# Patient Record
Sex: Female | Born: 1990
Health system: Southern US, Community
[De-identification: ages and names within clinical notes are randomized; demographics above are authoritative.]

## PROBLEM LIST (undated history)

## (undated) ENCOUNTER — Inpatient Hospital Stay (HOSPITAL_COMMUNITY): Payer: Self-pay

## (undated) DIAGNOSIS — J302 Other seasonal allergic rhinitis: Secondary | ICD-10-CM

## (undated) DIAGNOSIS — O139 Gestational [pregnancy-induced] hypertension without significant proteinuria, unspecified trimester: Secondary | ICD-10-CM

## (undated) DIAGNOSIS — R519 Headache, unspecified: Secondary | ICD-10-CM

## (undated) DIAGNOSIS — E079 Disorder of thyroid, unspecified: Secondary | ICD-10-CM

## (undated) DIAGNOSIS — E039 Hypothyroidism, unspecified: Secondary | ICD-10-CM

## (undated) DIAGNOSIS — R51 Headache: Secondary | ICD-10-CM

## (undated) HISTORY — DX: Disorder of thyroid, unspecified: E07.9

## (undated) HISTORY — PX: WISDOM TOOTH EXTRACTION: SHX21

## (undated) HISTORY — PX: OTHER SURGICAL HISTORY: SHX169

## (undated) HISTORY — DX: Gestational (pregnancy-induced) hypertension without significant proteinuria, unspecified trimester: O13.9

---

## 2011-05-15 ENCOUNTER — Encounter: Payer: Self-pay | Admitting: Internal Medicine

## 2011-05-15 ENCOUNTER — Ambulatory Visit (INDEPENDENT_AMBULATORY_CARE_PROVIDER_SITE_OTHER): Payer: BC Managed Care – PPO | Admitting: Internal Medicine

## 2011-05-15 VITALS — BP 114/72 | HR 80 | Temp 98.2°F | Resp 16 | Ht 66.0 in | Wt 201.0 lb

## 2011-05-15 DIAGNOSIS — E010 Iodine-deficiency related diffuse (endemic) goiter: Secondary | ICD-10-CM | POA: Insufficient documentation

## 2011-05-15 DIAGNOSIS — J310 Chronic rhinitis: Secondary | ICD-10-CM

## 2011-05-15 DIAGNOSIS — E669 Obesity, unspecified: Secondary | ICD-10-CM | POA: Insufficient documentation

## 2011-05-15 DIAGNOSIS — E079 Disorder of thyroid, unspecified: Secondary | ICD-10-CM

## 2011-05-15 DIAGNOSIS — E039 Hypothyroidism, unspecified: Secondary | ICD-10-CM

## 2011-05-15 LAB — COMPREHENSIVE METABOLIC PANEL
ALT: 17 U/L (ref 0–35)
AST: 17 U/L (ref 0–37)
Albumin: 4.3 g/dL (ref 3.5–5.2)
Alkaline Phosphatase: 50 U/L (ref 39–117)
BUN: 11 mg/dL (ref 6–23)
CO2: 27 mEq/L (ref 19–32)
Calcium: 9.2 mg/dL (ref 8.4–10.5)
Chloride: 104 mEq/L (ref 96–112)
Creatinine, Ser: 0.7 mg/dL (ref 0.4–1.2)
GFR: 121.29 mL/min (ref >60.00)
Glucose, Bld: 86 mg/dL (ref 70–99)
Potassium: 4 mEq/L (ref 3.5–5.1)
Sodium: 138 mEq/L (ref 135–145)
Total Bilirubin: 0.3 mg/dL (ref 0.3–1.2)
Total Protein: 7.4 g/dL (ref 6.0–8.3)

## 2011-05-15 LAB — LIPID PANEL
Cholesterol: 175 mg/dL (ref 0–200)
HDL: 49.5 mg/dL (ref >39.00)
LDL Cholesterol: 117 mg/dL — ABNORMAL HIGH (ref 0–99)
Total CHOL/HDL Ratio: 4
Triglycerides: 43 mg/dL (ref 0.0–149.0)
VLDL: 8.6 mg/dL (ref 0.0–40.0)

## 2011-05-15 LAB — TSH: TSH: 0.72 u[IU]/mL (ref 0.35–5.50)

## 2011-05-15 NOTE — Patient Instructions (Signed)
We will send you results of labs by phone or My Chart

## 2011-05-15 NOTE — Progress Notes (Signed)
Patient ID: Christina Costa, female   DOB: 1990-09-19, 21 y.o.   MRN: [id number] Patient Active Problem List  Diagnoses  . hypothyroidisn  . Obesity (BMI 30-39.9)    Subjective:  CC:   Chief Complaint  Patient presents with  . New Patient    HPI:   Christina Murdockis a 21 y.o. female who presents to establish care.  She is a healthy 21 yr old female G0P0 who has had treated hypothyroidism since age 89.  Menarche at age 84. Regular menstrual periods. Occasional episodes of allergic rhinitis, seasonal, for the last two years ,  managed with zyrtec.    Works full time at  [employer] therapist .  Christina Costa to relax at home on her days off. No regular exercise program,  No recent changes in weight.  She has plans to start exercising because she does want to lose weight.     Past Medical History  Diagnosis Date  . hypothyroidisn     since age 5    History reviewed. No pertinent past surgical history.       The following portions of the patient's history were reviewed and updated as appropriate: Allergies, current medications, and problem list.    Review of Systems:   12 Pt  review of systems was negative except those addressed in the HPI,     History   Social History  . Marital Status: Single    Spouse Name: N/A    Number of Children: N/A  . Years of Education: N/A   Occupational History  . Not on file.   Social History Main Topics  . Smoking status: Never Smoker   . Smokeless tobacco: Never Used  . Alcohol Use: No  . Drug Use: No  . Sexually Active: No   Other Topics Concern  . Not on file   Social History Narrative  . No narrative on file    Objective:  BP 114/72  Pulse 80  Temp(Src) 98.2 F (36.8 C) (Oral)  Resp 16  Ht 5\' 6"  (1.676 m)  Wt 201 lb (91.173 kg)  BMI 32.44 kg/m2  SpO2 100%  LMP 05/02/2011  General appearance: alert, cooperative and appears stated age Ears: normal TM's and external ear canals both ears Throat: lips, mucosa,  and tongue normal; teeth and gums normal Neck: no adenopathy, no carotid bruit, supple, symmetrical, trachea midline and thyroid not enlarged, symmetric, no tenderness/mass/nodules Back: symmetric, no curvature. ROM normal. No CVA tenderness. Lungs: clear to auscultation bilaterally Heart: regular rate and rhythm, S1, S2 normal, no murmur, click, rub or gallop Abdomen: soft, non-tender; bowel sounds normal; no masses,  no organomegaly Pulses: 2+ and symmetric Skin: Skin color, texture, turgor normal. No rashes or lesions Lymph nodes: Cervical, supraclavicular, and axillary nodes normal.  Assessment and Plan:  Obesity (BMI 30-39.9) I have addressed  BMI and recommended a low glycemic index diet utilizing smaller more frequent meals to increase metabolism.  I have also recommended that patient start exercising with a goal of 30 minutes of aerobic exercise a minimum of 5 days per week. Screening for lipid disorders, thyroid and diabetes to be done today.   hypothyroidisn Treated since age 71.  TSH due. Discussed goal to keep TSH around 1.     Updated Medication List Outpatient Encounter Prescriptions as of 05/15/2011  Medication Sig Dispense Refill  . cetirizine (ZYRTEC) 10 MG tablet Take 10 mg by mouth daily.      . Cholecalciferol (VITAMIN D3) 2000 UNITS TABS  Take by mouth.      . fish oil-omega-3 fatty acids 1000 MG capsule Take 2 g by mouth daily.      . Iron-Folic Acid-Vit B12 (IRON FORMULA PO) Take by mouth.      . levothyroxine (SYNTHROID, LEVOTHROID) 100 MCG tablet Take 100 mcg by mouth daily.      . Multiple Vitamin (MULTIVITAMIN) tablet Take 1 tablet by mouth daily.         Orders Placed This Encounter  Procedures  . TSH  . COMPLETE METABOLIC PANEL WITH GFR  . Lipid panel  . Comprehensive metabolic panel    No Follow-up on file.

## 2011-05-18 ENCOUNTER — Encounter: Payer: Self-pay | Admitting: Internal Medicine

## 2011-05-18 NOTE — Assessment & Plan Note (Signed)
Treated since age 21.  TSH due. Discussed goal to keep TSH around 1.

## 2011-05-18 NOTE — Assessment & Plan Note (Signed)
I have addressed  BMI and recommended a low glycemic index diet utilizing smaller more frequent meals to increase metabolism.  I have also recommended that patient start exercising with a goal of 30 minutes of aerobic exercise a minimum of 5 days per week. Screening for lipid disorders, thyroid and diabetes to be done today.   

## 2011-05-21 ENCOUNTER — Encounter: Payer: Self-pay | Admitting: Internal Medicine

## 2011-05-26 ENCOUNTER — Encounter: Payer: Self-pay | Admitting: Internal Medicine

## 2011-05-27 ENCOUNTER — Other Ambulatory Visit: Payer: Self-pay | Admitting: *Deleted

## 2011-05-27 ENCOUNTER — Telehealth: Payer: Self-pay | Admitting: Internal Medicine

## 2011-05-27 DIAGNOSIS — E039 Hypothyroidism, unspecified: Secondary | ICD-10-CM

## 2011-05-27 MED ORDER — LEVOTHYROXINE SODIUM 100 MCG PO TABS: 100.0000 ug | ORAL_TABLET | Freq: Every day | ORAL | Status: DC

## 2011-05-27 NOTE — Telephone Encounter (Signed)
[facility] called and wanted to let you know they got a refill that Savahanna D sent in for patient for Levoxyl.  [facility] stated Levoxyl is on back order so the pharmacist said levothyroxine is a close medication.  They have substituted this medication for patient.

## 2011-06-02 ENCOUNTER — Other Ambulatory Visit: Payer: Self-pay | Admitting: Internal Medicine

## 2011-06-02 DIAGNOSIS — E039 Hypothyroidism, unspecified: Secondary | ICD-10-CM

## 2011-06-02 MED ORDER — LEVOTHYROXINE SODIUM 100 MCG PO TABS: 100.0000 ug | ORAL_TABLET | Freq: Every day | ORAL | Status: DC

## 2011-08-31 ENCOUNTER — Encounter: Payer: Self-pay | Admitting: Internal Medicine

## 2011-09-01 ENCOUNTER — Other Ambulatory Visit: Payer: Self-pay | Admitting: *Deleted

## 2011-09-01 DIAGNOSIS — E039 Hypothyroidism, unspecified: Secondary | ICD-10-CM

## 2011-09-01 MED ORDER — LEVOTHYROXINE SODIUM 100 MCG PO TABS: 100.0000 ug | ORAL_TABLET | Freq: Every day | ORAL | Status: DC

## 2012-03-05 ENCOUNTER — Encounter: Payer: Self-pay | Admitting: Adult Health

## 2012-03-05 ENCOUNTER — Ambulatory Visit (INDEPENDENT_AMBULATORY_CARE_PROVIDER_SITE_OTHER): Payer: BC Managed Care – PPO | Admitting: Adult Health

## 2012-03-05 ENCOUNTER — Ambulatory Visit (INDEPENDENT_AMBULATORY_CARE_PROVIDER_SITE_OTHER)
Admission: RE | Admit: 2012-03-05 | Discharge: 2012-03-05 | Disposition: A | Discharge status: Home / Self Care | Payer: BC Managed Care – PPO | Source: Ambulatory Visit | Attending: Adult Health | Admitting: Adult Health

## 2012-03-05 VITALS — BP 118/80 | HR 74 | Temp 98.6°F | Resp 14 | Ht 66.0 in | Wt 198.0 lb

## 2012-03-05 DIAGNOSIS — M25519 Pain in unspecified shoulder: Secondary | ICD-10-CM

## 2012-03-05 DIAGNOSIS — E039 Hypothyroidism, unspecified: Secondary | ICD-10-CM

## 2012-03-05 MED ORDER — CYCLOBENZAPRINE HCL 5 MG PO TABS: 5.0000 mg | ORAL_TABLET | Freq: Three times a day (TID) | ORAL | Status: DC | PRN

## 2012-03-05 MED ORDER — LEVOTHYROXINE SODIUM 100 MCG PO TABS: 100.0000 ug | ORAL_TABLET | Freq: Every day | ORAL | Status: DC

## 2012-03-05 NOTE — Progress Notes (Signed)
  Subjective:    Patient ID: Christina Costa, female    DOB: Feb 22, 1990, 22 y.o.   MRN: [id number]  HPI  Patient is a pleasant 22 y/o female who presents to clinic with c/o right shoulder pain that occasionally radiates to her neck. 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 She reports that she helped stack some wood for a relative on Monday evening. On Tuesday morning she woke up with the right shoulder pain. She did not think much about it and went to work. She notice the pain began to intensify especially while using her right elbow to apply pressure during her massage sessions. She has applied ice and taken ibuprofen since Wednesday when she had a day off at work. She describes the pain as dull with occasional sharp pain. Denies temperature alteration, color changes, numbness or loss of sensation in her right extremity.  Current Outpatient Prescriptions on File Prior to Visit  Medication Sig Dispense Refill  . levothyroxine (SYNTHROID, LEVOTHROID) 100 MCG tablet Take 1 tablet (100 mcg total) by mouth daily.  90 tablet  3  . cetirizine (ZYRTEC) 10 MG tablet Take 10 mg by mouth daily.      . Cholecalciferol (VITAMIN D3) 2000 UNITS TABS Take by mouth.      . fish oil-omega-3 fatty acids 1000 MG capsule Take 2 g by mouth daily.      . Iron-Folic Acid-Vit B12 (IRON FORMULA PO) Take by mouth.      . Multiple Vitamin (MULTIVITAMIN) tablet Take 1 tablet by mouth daily.         Review of Systems  Constitutional: Negative.   Respiratory: Negative.   Cardiovascular: Negative.   Musculoskeletal: Positive for joint swelling.       Pain in right shoulder area.  Skin: Negative for color change, pallor and rash.  Neurological: Negative for dizziness, weakness, light-headedness, numbness and headaches.  Psychiatric/Behavioral: Negative.     BP 118/80  Pulse 74  Temp 98.6 F (37 C) (Oral)  Resp 14  Ht 5\' 6"  (1.676 m)  Wt 198 lb (89.812 kg)  BMI 31.96 kg/m2  SpO2  100%  LMP 02/18/2012     Objective:   Physical Exam  Constitutional: She is oriented to person, place, and time. She appears well-developed and well-nourished. No distress.  Neck: Normal range of motion.  Cardiovascular: Normal rate, regular rhythm and intact distal pulses.   Pulmonary/Chest: Effort normal.  Musculoskeletal: She exhibits edema and tenderness.       Full range of motion right shoulder. Tenderness upon palpation of of trapezius muscle on the right. Tightness of trapezius noted. Palpation of shoulder joint without any abnormality noted. Comparison of bilateral shoulders shows slight swelling on the right superior aspect and significant tightness of muscles on the right.  Neurological: She is alert and oriented to person, place, and time. Coordination normal.  Skin: Skin is warm and dry. No rash noted. No erythema.  Psychiatric: She has a normal mood and affect. Her behavior is normal. Judgment and thought content normal.       Assessment & Plan:

## 2012-03-05 NOTE — Assessment & Plan Note (Addendum)
Right shoulder. Suspect this is muscular in nature given physical exam. Tight Trapezius muscle with slight inflammation of area. Will check routine shoulder xray to r/o any bony abnormality of the area. Start high dose NSAIDs 3 times daily for 3-4 days. Flexeril for muscle relaxation. Apply ice alternating with heat 3-4 times daily. Rest and immobilize joint with sling. Do ROM exercises as tolerated 3-4 times daily. Do not sleep on the right shoulder. Call if symptoms do not improve within 4-5 days. If no resolution will need referral to Ortho.

## 2012-03-05 NOTE — Patient Instructions (Addendum)
  Please go to our [facility] office for xray of your right shoulder.  Immobilize joint for the next 4-5 days with a sling. Gently move joint 3-4 times a day without over-stressing joint then put back in sling.  I have sent a prescription for Flexeril (muscle relaxer) to your pharmacy. You can take this 3 times a day. This medication can make you drowsy.  Take ibuprofen 600 mg - 800 mg, 3 times a day for the next 4 days.  Apply ice alternating with heat 3-4 times a day for 20 minutes at a time.  Please call if your symptoms are not improved within 4-5 days. Send me a MyChart message regarding how you are doing on Tuesday morning.

## 2012-03-08 ENCOUNTER — Other Ambulatory Visit: Payer: Self-pay | Admitting: Adult Health

## 2012-03-08 ENCOUNTER — Encounter: Payer: Self-pay | Admitting: Adult Health

## 2012-03-08 DIAGNOSIS — M25519 Pain in unspecified shoulder: Secondary | ICD-10-CM

## 2012-03-27 ENCOUNTER — Other Ambulatory Visit: Payer: Self-pay

## 2012-12-16 ENCOUNTER — Other Ambulatory Visit: Payer: Self-pay

## 2014-01-29 ENCOUNTER — Emergency Department (HOSPITAL_COMMUNITY)
Admission: EM | Admit: 2014-01-29 | Discharge: 2014-01-29 | Disposition: A | Discharge status: Home / Self Care | Payer: BC Managed Care – PPO | Attending: Emergency Medicine | Admitting: Emergency Medicine

## 2014-01-29 ENCOUNTER — Emergency Department (HOSPITAL_COMMUNITY): Payer: BC Managed Care – PPO

## 2014-01-29 DIAGNOSIS — Z3202 Encounter for pregnancy test, result negative: Secondary | ICD-10-CM | POA: Diagnosis not present

## 2014-01-29 DIAGNOSIS — E039 Hypothyroidism, unspecified: Secondary | ICD-10-CM | POA: Insufficient documentation

## 2014-01-29 DIAGNOSIS — N83209 Unspecified ovarian cyst, unspecified side: Secondary | ICD-10-CM

## 2014-01-29 DIAGNOSIS — N8320 Unspecified ovarian cysts: Secondary | ICD-10-CM | POA: Insufficient documentation

## 2014-01-29 DIAGNOSIS — R112 Nausea with vomiting, unspecified: Secondary | ICD-10-CM | POA: Diagnosis present

## 2014-01-29 DIAGNOSIS — Z79899 Other long term (current) drug therapy: Secondary | ICD-10-CM | POA: Diagnosis not present

## 2014-01-29 DIAGNOSIS — R1010 Upper abdominal pain, unspecified: Secondary | ICD-10-CM

## 2014-01-29 LAB — CBC WITH DIFFERENTIAL/PLATELET
Basophils Absolute: 0 10*3/uL (ref 0.0–0.1)
Basophils Relative: 0 % (ref 0–1)
Eosinophils Absolute: 0 10*3/uL (ref 0.0–0.7)
Eosinophils Relative: 0 % (ref 0–5)
HCT: 40.9 % (ref 36.0–46.0)
Hemoglobin: 13.8 g/dL (ref 12.0–15.0)
Lymphocytes Relative: 2 % — ABNORMAL LOW (ref 12–46)
Lymphs Abs: 0.3 10*3/uL — ABNORMAL LOW (ref 0.7–4.0)
MCH: 27.4 pg (ref 26.0–34.0)
MCHC: 33.7 g/dL (ref 30.0–36.0)
MCV: 81.3 fL (ref 78.0–100.0)
Monocytes Absolute: 0.5 10*3/uL (ref 0.1–1.0)
Monocytes Relative: 4 % (ref 3–12)
Neutro Abs: 12.6 10*3/uL — ABNORMAL HIGH (ref 1.7–7.7)
Neutrophils Relative %: 94 % — ABNORMAL HIGH (ref 43–77)
Platelets: 203 10*3/uL (ref 150–400)
RBC: 5.03 MIL/uL (ref 3.87–5.11)
RDW: 14.1 % (ref 11.5–15.5)
WBC: 13.5 10*3/uL — ABNORMAL HIGH (ref 4.0–10.5)

## 2014-01-29 LAB — POC URINE PREG, ED: Preg Test, Ur: NEGATIVE

## 2014-01-29 LAB — URINALYSIS, ROUTINE W REFLEX MICROSCOPIC
Bilirubin Urine: NEGATIVE
Glucose, UA: NEGATIVE mg/dL
Hgb urine dipstick: NEGATIVE
Ketones, ur: NEGATIVE mg/dL
Leukocytes, UA: NEGATIVE
Nitrite: NEGATIVE
Protein, ur: NEGATIVE mg/dL
Specific Gravity, Urine: 1.025 (ref 1.005–1.030)
Urobilinogen, UA: 0.2 mg/dL (ref 0.0–1.0)
pH: 7 (ref 5.0–8.0)

## 2014-01-29 LAB — COMPREHENSIVE METABOLIC PANEL
ALT: 16 U/L (ref 0–35)
AST: 17 U/L (ref 0–37)
Albumin: 4.5 g/dL (ref 3.5–5.2)
Alkaline Phosphatase: 61 U/L (ref 39–117)
Anion gap: 14 (ref 5–15)
BUN: 17 mg/dL (ref 6–23)
CO2: 23 mEq/L (ref 19–32)
Calcium: 9.2 mg/dL (ref 8.4–10.5)
Chloride: 103 mEq/L (ref 96–112)
Creatinine, Ser: 0.69 mg/dL (ref 0.50–1.10)
GFR calc Af Amer: >90 mL/min (ref >90)
GFR calc non Af Amer: >90 mL/min (ref >90)
Glucose, Bld: 104 mg/dL — ABNORMAL HIGH (ref 70–99)
Potassium: 4.5 mEq/L (ref 3.7–5.3)
Sodium: 140 mEq/L (ref 137–147)
Total Bilirubin: 0.4 mg/dL (ref 0.3–1.2)
Total Protein: 7.8 g/dL (ref 6.0–8.3)

## 2014-01-29 LAB — LIPASE, BLOOD: Lipase: 36 U/L (ref 11–59)

## 2014-01-29 MED ORDER — METOCLOPRAMIDE HCL 5 MG/ML IJ SOLN
10.0000 mg | Freq: Once | INTRAMUSCULAR | Status: AC
  Administered 2014-01-29: 10 mg via INTRAVENOUS
  Filled 2014-01-29: qty 2

## 2014-01-29 MED ORDER — PROMETHAZINE HCL 25 MG PO TABS: 25.0000 mg | ORAL_TABLET | Freq: Four times a day (QID) | ORAL | Status: DC | PRN

## 2014-01-29 MED ORDER — SODIUM CHLORIDE 0.9 % IV BOLUS (SEPSIS)
1000.0000 mL | Freq: Once | INTRAVENOUS | Status: AC
  Administered 2014-01-29: 1000 mL via INTRAVENOUS

## 2014-01-29 MED ORDER — GI COCKTAIL ~~LOC~~
30.0000 mL | Freq: Once | ORAL | Status: AC
  Administered 2014-01-29: 30 mL via ORAL
  Filled 2014-01-29: qty 30

## 2014-01-29 MED ORDER — TRAMADOL HCL 50 MG PO TABS: 50.0000 mg | ORAL_TABLET | Freq: Four times a day (QID) | ORAL | Status: DC | PRN

## 2014-01-29 MED ORDER — MORPHINE SULFATE 4 MG/ML IJ SOLN
4.0000 mg | Freq: Once | INTRAMUSCULAR | Status: AC
  Administered 2014-01-29: 4 mg via INTRAVENOUS
  Filled 2014-01-29: qty 1

## 2014-01-29 MED ORDER — IOHEXOL 300 MG/ML  SOLN
100.0000 mL | Freq: Once | INTRAMUSCULAR | Status: AC | PRN
  Administered 2014-01-29: 100 mL via INTRAVENOUS

## 2014-01-29 MED ORDER — ONDANSETRON 4 MG PO TBDP
8.0000 mg | ORAL_TABLET | Freq: Once | ORAL | Status: AC
  Administered 2014-01-29: 8 mg via ORAL
  Filled 2014-01-29: qty 2

## 2014-01-29 NOTE — ED Notes (Signed)
Pt reports having mid upper abd pain and n/v since this am. Denies diarrhea.

## 2014-01-29 NOTE — ED Notes (Signed)
Pt in stable condition and verbalizes understanding of d/c instructions. Pt wishes to ambulate from ED and denies use of a wheelchair.

## 2014-01-29 NOTE — ED Provider Notes (Signed)
CSN: [id number]     Arrival date & time 01/29/14  1210 History   First MD Initiated Contact with Patient 01/29/14 1245     Chief Complaint  Patient presents with  . Emesis  . Abdominal Pain     (Consider location/radiation/quality/duration/timing/severity/associated sxs/prior Treatment) HPI Pt is a 23yo female with hx of hypothyroidism presenting to ED with c/o sudden onset of upper abdominal pain associated with nausea and vomiting that started this this morning waking pt from her sleep.  Pt reports having over 10 episodes of vomiting PTA. Reports scant red blood in emesis at this time stating "I don't have anything else left in my stomach."  Abdominal pain is cramping and sharp "like someone is twisting my insides."  8/10 at worst, radiates to her mid-back.  Denies hx of similar symptoms.  Reports eating dinner last night, same food as her mother, felt fine.  Mother still feels fine.  No known sick contacts. Denies fever, chills, or diarrhea. Denies urinary or vaginal symptoms.  Denies chest pain or SOB.  Denies hx of abdominal surgeries.  Pt reports being glucose intolerant but denies any new foods. No other significant PMH. LMP: 12/30/13.  Past Medical History  Diagnosis Date  . hypothyroidisn     since age 23   No past surgical history on file. Family History  Problem Relation Age of Onset  . Allergy (severe) Mother   . Hypothyroidism Mother   . Arthritis Maternal Grandmother   . Heart disease Maternal Grandfather    History  Substance Use Topics  . Smoking status: Never Smoker   . Smokeless tobacco: Never Used  . Alcohol Use: No   OB History    No data available     Review of Systems  Constitutional: Negative for fever, chills and diaphoresis.  Respiratory: Negative for cough and shortness of breath.   Gastrointestinal: Positive for nausea, vomiting and abdominal pain ( epigastric, LUQ). Negative for diarrhea.  Genitourinary: Negative for dysuria, urgency, frequency,  flank pain, decreased urine volume, vaginal bleeding, vaginal discharge and pelvic pain.  All other systems reviewed and are negative.     Allergies  Gluten meal  Home Medications   Prior to Admission medications   Medication Sig Start Date End Date Taking? Authorizing Provider  IODINE, KELP, PO Take 1 tablet by mouth daily.   Yes Historical Provider, MD  levothyroxine (SYNTHROID, LEVOTHROID) 100 MCG tablet Take 1 tablet (100 mcg total) by mouth daily. 03/05/12  Yes Raquel Conni ElliotM Rey, NP  LEVOXYL 50 MCG tablet Take 25 mcg by mouth daily. 01/03/14  Yes Historical Provider, MD  cyclobenzaprine (FLEXERIL) 5 MG tablet Take 1 tablet (5 mg total) by mouth 3 (three) times daily as needed for muscle spasms. 03/05/12   Raquel Conni ElliotM Rey, NP  promethazine (PHENERGAN) 25 MG tablet Take 1 tablet (25 mg total) by mouth every 6 (six) hours as needed for nausea or vomiting. 01/29/14   Junius FinnerErin O'Malley, PA-C  traMADol (ULTRAM) 50 MG tablet Take 1 tablet (50 mg total) by mouth every 6 (six) hours as needed. 01/29/14   Junius FinnerErin O'Malley, PA-C   BP 128/97 mmHg  Pulse 102  Temp(Src) 97.7 F (36.5 C) (Oral)  Resp 16  Ht 5\' 6"  (1.676 m)  Wt 214 lb 2 oz (97.126 kg)  BMI 34.58 kg/m2  SpO2 100%  LMP 01/01/2014 Physical Exam  Constitutional: She appears well-developed and well-nourished. She appears distressed.  Pt sitting in exam bed with knees bent up to her chest.  Appears mildly uncomfortable.  HENT:  Head: Normocephalic and atraumatic.  Eyes: Conjunctivae are normal. No scleral icterus.  Neck: Normal range of motion.  Cardiovascular: Normal rate, regular rhythm and normal heart sounds.   Pulmonary/Chest: Effort normal and breath sounds normal. No respiratory distress. She has no wheezes. She has no rales. She exhibits no tenderness.  Abdominal: Soft. Bowel sounds are normal. She exhibits no distension and no mass. There is tenderness. There is no rebound and no guarding.  Soft, non-distended. Normal bowel sounds.  Tenderness to epigastrium and LUQ w/o rebound, guarding, or mass. No CVAT  Musculoskeletal: Normal range of motion.  Neurological: She is alert.  Skin: Skin is warm and dry. She is not diaphoretic.  Nursing note and vitals reviewed.   ED Course  Procedures (including critical care time) Labs Review Labs Reviewed  CBC WITH DIFFERENTIAL - Abnormal; Notable for the following:    WBC 13.5 (*)    Neutrophils Relative % 94 (*)    Neutro Abs 12.6 (*)    Lymphocytes Relative 2 (*)    Lymphs Abs 0.3 (*)    All other components within normal limits  COMPREHENSIVE METABOLIC PANEL - Abnormal; Notable for the following:    Glucose, Bld 104 (*)    All other components within normal limits  LIPASE, BLOOD  URINALYSIS, ROUTINE W REFLEX MICROSCOPIC  POC URINE PREG, ED    Imaging Review Ct Abdomen Pelvis W Contrast  01/29/2014   CLINICAL DATA:  Abdominal pain with emesis  EXAM: CT ABDOMEN AND PELVIS WITH CONTRAST  TECHNIQUE: Multidetector CT imaging of the abdomen and pelvis was performed using the standard protocol following bolus administration of intravenous contrast.  CONTRAST:  OMNIPAQUE IOHEXOL 300 MG/ML  SOLN  COMPARISON:  None.  FINDINGS: Lung bases are clear.  There is mild fatty infiltration in the liver. No focal liver lesions are identified. The gallbladder wall is not thickened. There is no biliary duct dilatation.  Spleen, pancreas, and adrenals appear normal.  Kidneys bilaterally show no mass or hydronephrosis. There is no renal or ureteral calculus on either side.  In the pelvis, the urinary bladder is midline with normal wall thickness. There is a small amount of free fluid in the cul-de-sac. The uterus has a septate appearance with two uterine horns more superiorly. There is no pelvic mass appreciable. Appendix region appears normal without inflammation.  There is a small ventral hernia containing only fat.  There is no bowel obstruction. No free air or portal venous air. There is  no appreciable adenopathy or abscess in the abdomen or pelvis. There is no demonstrable abdominal aortic aneurysm. There are no blastic or lytic bone lesions.  IMPRESSION: No mesenteric inflammation or abscess. No appendiceal region inflammation. No renal or ureteral calculus. No hydronephrosis.  Small amount of free fluid in cul-de-sac. Suspect recent ovarian cyst rupture.  Evidence of a septate uterus.  Small ventral hernia containing only fat.  Mild hepatic steatosis.   Electronically Signed   By: Bretta Bang M.D.   On: 01/29/2014 15:08     EKG Interpretation None      MDM   Final diagnoses:  Upper abdominal pain  Nausea and vomiting in adult  Ovarian cyst rupture    Pt is a 23yo female presenting to ED with c/o upper abdominal pain, nausea and vomiting, started this morning.  No significant PMH. On exam, pt tender in epigastrium and LUQ. No rebound or guarding. Pt is afebrile, mild tachycardia in triage, likely  due to pt being in pain as she denies CP or SOB, O2-100% on RA. Doubt PE.   Tx in ED: zofran in triage which helped pt's nausea, IV fluids and morphine.    Labs: mildly elevated WBC 13.5, labs otherwise unremarkable. Normal UA, Urine preg: negative.   Discussed pt with Dr. Radford PaxBeaton, due to pt having upper abdominal pain with the nausea and vomiting w/o diarrhea, and no known sick contacts or bad food, will get CT abdomen for further evaluation, concern for cholecystitis vs cholelithiasis vs SBO.    3:25 PM CT abdomen significant for suspected recent ovarian cyst rupture, otherwise unremarkable. No mesenteric inflammation or abscess. No appendiceal region inflammation. No renal or ureteral  calculus. No hydronephrosis.  No evidence of emergent process taking place. Discussed imaging with pt.  Pt is agreeable with plan to discharge home pending PO fluid challenge. Pt also requesting more pain medication stating pain is starting to return.    Pt able to keep down PO fluids. Will  discharge home to f/u with PCP in 2-3 days for recheck of symptoms if not improving. Rx: tramadol and phenergan. Home care instructions provided. Return precautions provided. Pt verbalized understanding and agreement with tx plan.         Junius Finnerrin O'Malley, PA-C 01/29/14 1604  Nelia Shiobert L Beaton, MD 01/29/14 [phone]

## 2014-12-01 ENCOUNTER — Encounter: Payer: Self-pay | Admitting: Nurse Practitioner

## 2014-12-01 ENCOUNTER — Ambulatory Visit (INDEPENDENT_AMBULATORY_CARE_PROVIDER_SITE_OTHER): Payer: BC Managed Care – PPO | Admitting: Nurse Practitioner

## 2014-12-01 VITALS — BP 130/98 | HR 84 | Temp 98.4°F | Resp 12 | Ht 66.0 in | Wt 228.2 lb

## 2014-12-01 DIAGNOSIS — R110 Nausea: Secondary | ICD-10-CM | POA: Diagnosis not present

## 2014-12-01 DIAGNOSIS — K920 Hematemesis: Secondary | ICD-10-CM | POA: Diagnosis not present

## 2014-12-01 LAB — COMPREHENSIVE METABOLIC PANEL
ALT: 20 U/L (ref 0–35)
AST: 18 U/L (ref 0–37)
Albumin: 4.6 g/dL (ref 3.5–5.2)
Alkaline Phosphatase: 53 U/L (ref 39–117)
BUN: 9 mg/dL (ref 6–23)
CO2: 29 mEq/L (ref 19–32)
Calcium: 9.7 mg/dL (ref 8.4–10.5)
Chloride: 103 mEq/L (ref 96–112)
Creatinine, Ser: 0.79 mg/dL (ref 0.40–1.20)
GFR: 95.35 mL/min (ref >60.00)
Glucose, Bld: 97 mg/dL (ref 70–99)
Potassium: 4.1 mEq/L (ref 3.5–5.1)
Sodium: 139 mEq/L (ref 135–145)
Total Bilirubin: 0.3 mg/dL (ref 0.2–1.2)
Total Protein: 7.5 g/dL (ref 6.0–8.3)

## 2014-12-01 LAB — CBC WITH DIFFERENTIAL/PLATELET
Basophils Absolute: 0 10*3/uL (ref 0.0–0.1)
Basophils Relative: 0.2 % (ref 0.0–3.0)
Eosinophils Absolute: 0 10*3/uL (ref 0.0–0.7)
Eosinophils Relative: 0.5 % (ref 0.0–5.0)
HCT: 40.1 % (ref 36.0–46.0)
Hemoglobin: 13.1 g/dL (ref 12.0–15.0)
Lymphocytes Relative: 23.1 % (ref 12.0–46.0)
Lymphs Abs: 2 10*3/uL (ref 0.7–4.0)
MCHC: 32.7 g/dL (ref 30.0–36.0)
MCV: 82.1 fl (ref 78.0–100.0)
Monocytes Absolute: 0.5 10*3/uL (ref 0.1–1.0)
Monocytes Relative: 6.1 % (ref 3.0–12.0)
Neutro Abs: 6 10*3/uL (ref 1.4–7.7)
Neutrophils Relative %: 70.1 % (ref 43.0–77.0)
Platelets: 252 10*3/uL (ref 150.0–400.0)
RBC: 4.89 Mil/uL (ref 3.87–5.11)
RDW: 14.3 % (ref 11.5–15.5)
WBC: 8.5 10*3/uL (ref 4.0–10.5)

## 2014-12-01 LAB — LIPASE: Lipase: 32 U/L (ref 11.0–59.0)

## 2014-12-01 MED ORDER — PROMETHAZINE HCL 25 MG PO TABS: 25.0000 mg | ORAL_TABLET | Freq: Four times a day (QID) | ORAL | Status: DC | PRN

## 2014-12-01 NOTE — Patient Instructions (Signed)
Please visit the lab before leaving today.   Lots of water and rest! If your urine turns dark despite drinking water or you are not urinating please seek emergency care.   Phenergan for nausea and small bland meals

## 2014-12-01 NOTE — Progress Notes (Signed)
Patient ID: Christina Costa, female    DOB: 1990-08-08  Age: 24 y.o. MRN: [id number]  CC: Nausea   HPI [name] presents for CC of nausea x 6 days approx.  1) Nausea- Daily then eases by the afternoon and back again in the evenings. Started late Sunday evening  Wednesday threw up some blood she reports, dry heaving with some fluid, bright red she reports and a streak, did not happen again.  Fatigue yesterday and this morning   Tylenol severe cold/flu Sun, Mon, Tue Essential oils for congestion- Doterra DigestZen oil   Drinking water, urine is pale she reports, BMs loose  Eating less, soup lunch and dinner   LMP- 11/12/14 through 11/15/14 heavier than usual   Not sexually active   History [name] has a past medical history of hypothyroidisn.   She has no past surgical history on file.   Her family history includes Allergy (severe) in her mother; Arthritis in her maternal grandmother; Heart disease in her maternal grandfather; Hypothyroidism in her mother.She reports that she has never smoked. She has never used smokeless tobacco. She reports that she does not drink alcohol or use illicit drugs.  Outpatient Prescriptions Prior to Visit  Medication Sig Dispense Refill  . cyclobenzaprine (FLEXERIL) 5 MG tablet Take 1 tablet (5 mg total) by mouth 3 (three) times daily as needed for muscle spasms. 20 tablet 0  . IODINE, KELP, PO Take 1 tablet by mouth daily.    Marland Kitchen levothyroxine (SYNTHROID, LEVOTHROID) 100 MCG tablet Take 1 tablet (100 mcg total) by mouth daily. 90 tablet 3  . LEVOXYL 50 MCG tablet Take 25 mcg by mouth daily.    . promethazine (PHENERGAN) 25 MG tablet Take 1 tablet (25 mg total) by mouth every 6 (six) hours as needed for nausea or vomiting. 10 tablet 0  . traMADol (ULTRAM) 50 MG tablet Take 1 tablet (50 mg total) by mouth every 6 (six) hours as needed. 15 tablet 0   No facility-administered medications prior to visit.    ROS Review of Systems  Constitutional: Positive  for appetite change and fatigue. Negative for fever, chills and diaphoresis.  HENT: Positive for congestion and postnasal drip. Negative for rhinorrhea, sinus pressure, sneezing and sore throat.   Eyes: Negative for visual disturbance.  Respiratory: Negative for cough, chest tightness and wheezing.   Cardiovascular: Negative for chest pain, palpitations and leg swelling.  Gastrointestinal: Positive for nausea, vomiting and diarrhea. Negative for abdominal pain, constipation, blood in stool, abdominal distention, anal bleeding and rectal pain.  Genitourinary: Negative for difficulty urinating.  Skin: Negative for rash.   Objective:  BP 130/98 mmHg  Pulse 84  Temp(Src) 98.4 F (36.9 C)  Resp 12  Ht '5\' 6"'  (1.676 m)  Wt 228 lb 3.2 oz (103.511 kg)  BMI 36.85 kg/m2  SpO2 98%  Physical Exam  Constitutional: She is oriented to person, place, and time. She appears well-developed and well-nourished. No distress.  HENT:  Head: Normocephalic and atraumatic.  Right Ear: External ear normal.  Left Ear: External ear normal.  Cardiovascular: Normal rate, regular rhythm, normal heart sounds and intact distal pulses.  Exam reveals no gallop and no friction rub.   No murmur heard. Pulmonary/Chest: Effort normal and breath sounds normal. No respiratory distress. She has no wheezes. She has no rales. She exhibits no tenderness.  Abdominal: Soft. Bowel sounds are normal. She exhibits no distension and no mass. There is no tenderness. There is no rebound and no guarding.  Neurological:  She is alert and oriented to person, place, and time. No cranial nerve deficit. She exhibits normal muscle tone. Coordination normal.  Skin: Skin is warm and dry. No rash noted. She is not diaphoretic.  Psychiatric: She has a normal mood and affect. Her behavior is normal. Judgment and thought content normal.   Assessment & Plan:   Sarrinah was seen today for nausea.  Diagnoses and all orders for this visit:  Hematemesis  with nausea -     CBC w/Diff -     Comp Met (CMET) -     Lipase  Other orders -     promethazine (PHENERGAN) 25 MG tablet; Take 1 tablet (25 mg total) by mouth every 6 (six) hours as needed for nausea or vomiting.   I have discontinued Ms. Murdock's traMADol. I am also having her maintain her cyclobenzaprine, levothyroxine, (IODINE, KELP, PO), LEVOXYL, and promethazine.  Meds ordered this encounter  Medications  . promethazine (PHENERGAN) 25 MG tablet    Sig: Take 1 tablet (25 mg total) by mouth every 6 (six) hours as needed for nausea or vomiting.    Dispense:  10 tablet    Refill:  0    Order Specific Question:  Supervising Provider    Answer:  Crecencio Mc [[id number]]     Follow-up: Return if symptoms worsen or fail to improve.

## 2014-12-01 NOTE — Progress Notes (Signed)
Pre visit review using our clinic review tool, if applicable. No additional management support is needed unless otherwise documented below in the visit note. 

## 2014-12-17 ENCOUNTER — Encounter: Payer: Self-pay | Admitting: Nurse Practitioner

## 2014-12-17 DIAGNOSIS — K920 Hematemesis: Secondary | ICD-10-CM | POA: Insufficient documentation

## 2014-12-17 NOTE — Assessment & Plan Note (Signed)
New onset. Small amount and 1 episode of hematemesis. Pt is well appearring today and with and unremarkable exam. Will give phenergan (sent to pharmacy), drink lots of non-caffeinated fluids, seek care if urine becomes dark or stops, rest and try bland foods. FU prn worsening/failure to improve.

## 2015-08-21 ENCOUNTER — Encounter: Payer: Self-pay | Admitting: Nurse Practitioner

## 2016-06-17 IMAGING — CT CT ABD-PELV W/ CM
2 of 4 series · 16 of 46 positions shown, 18 images · IV contrast (Omni 300)
Comparison: None.

CLINICAL DATA: Abdominal pain with emesis

EXAM:
CT ABDOMEN AND PELVIS WITH CONTRAST
TECHNIQUE: Multidetector CT imaging of the abdomen and pelvis was performed
using the standard protocol following bolus administration of
intravenous contrast.
CONTRAST:  100mL OMNIPAQUE IOHEXOL 300 MG/ML  SOLN

[Series 2: abd/ pelvis 5.0 i30f 1 · axial · 0.74mm/px · z∈[-325,+165]mm · 13 of 108 slices shown, 15 images]
[im 5/108  soft-tissue]
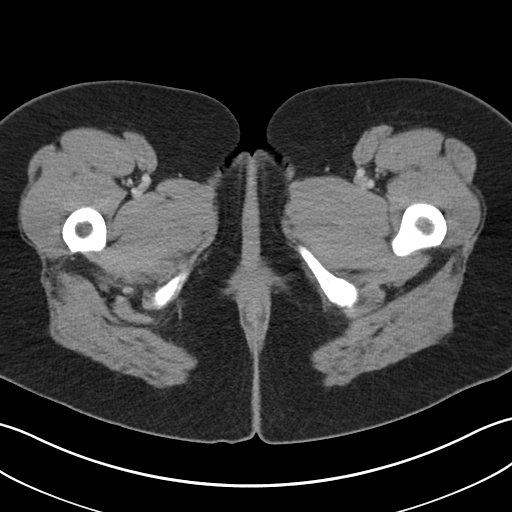
[im 5/108  bone]
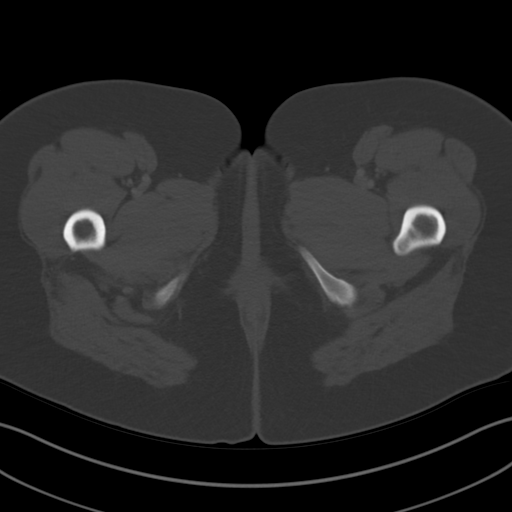
[im 13/108  soft-tissue]
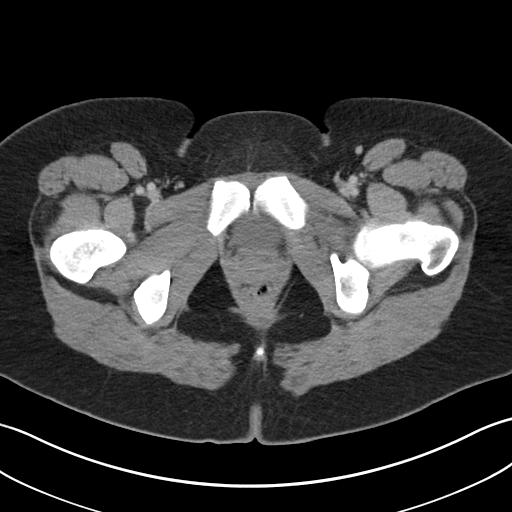
[im 22/108  soft-tissue]
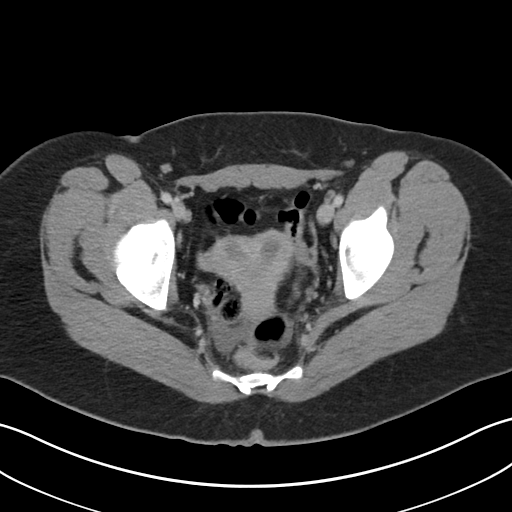
[im 30/108  soft-tissue]
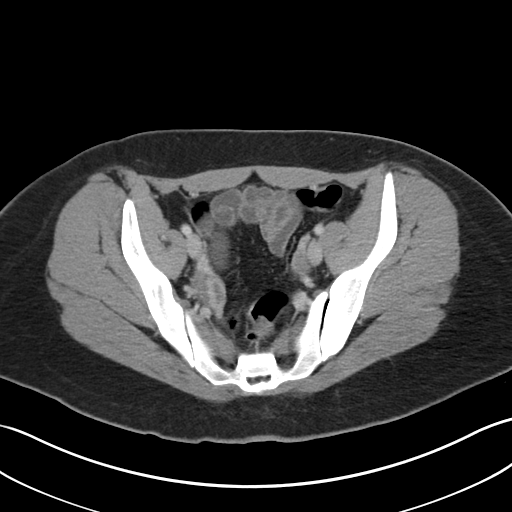
[im 39/108  soft-tissue]
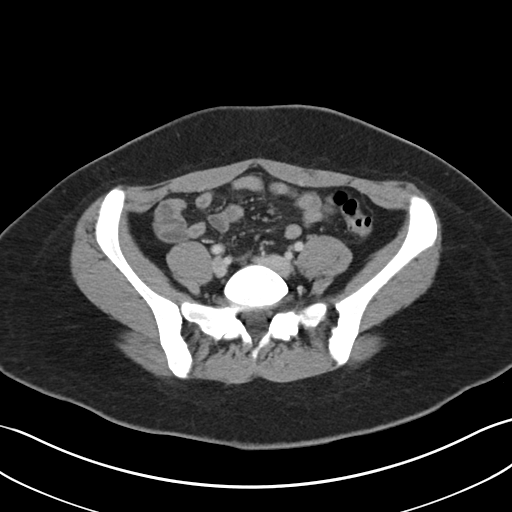
[im 48/108  soft-tissue]
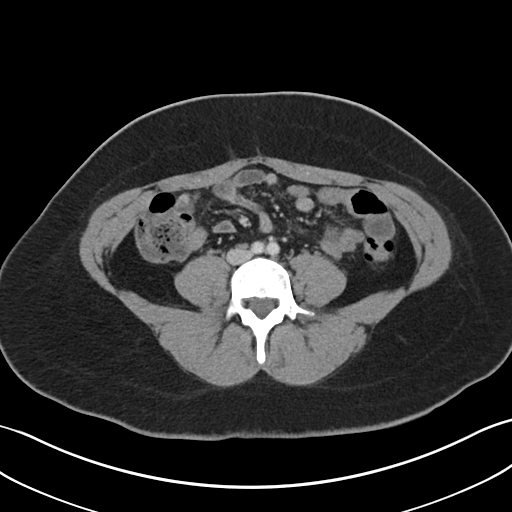
[im 56/108  soft-tissue]
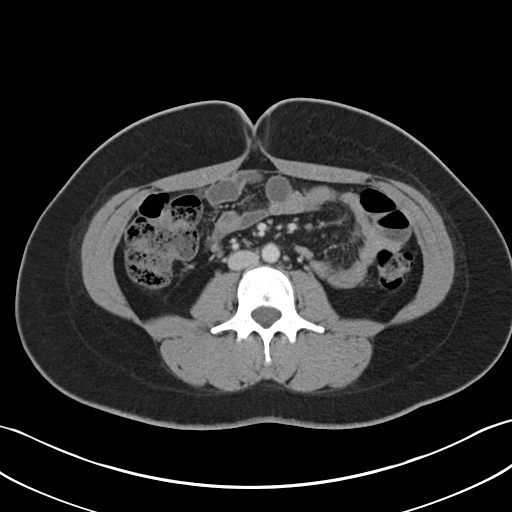
[im 60/108  soft-tissue]
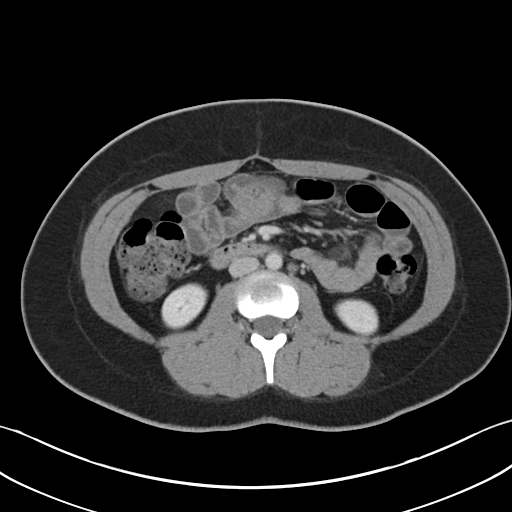
[im 69/108  soft-tissue]
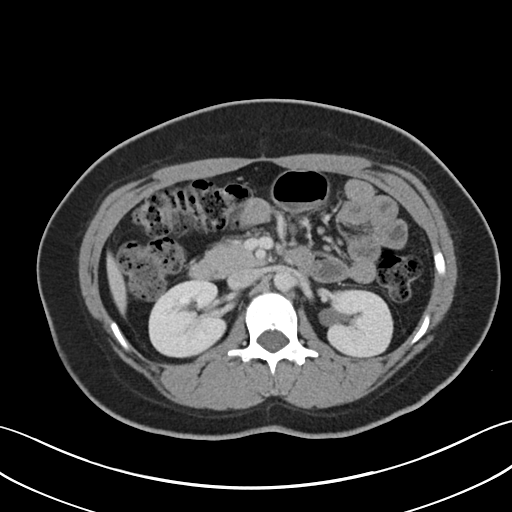
[im 69/108  bone]
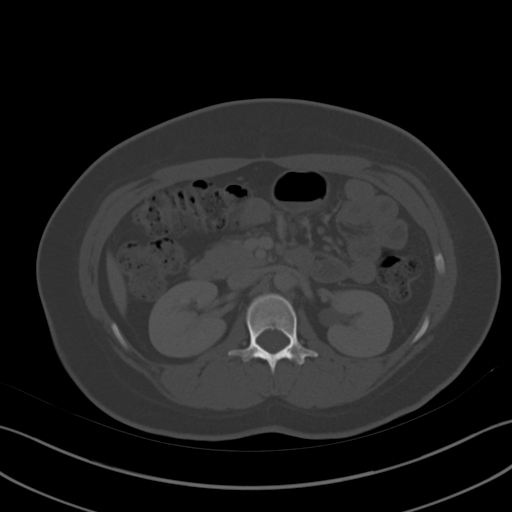
[im 78/108  soft-tissue]
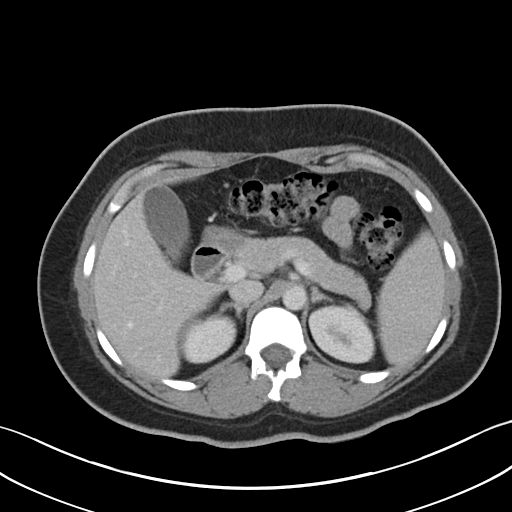
[im 86/108  soft-tissue]
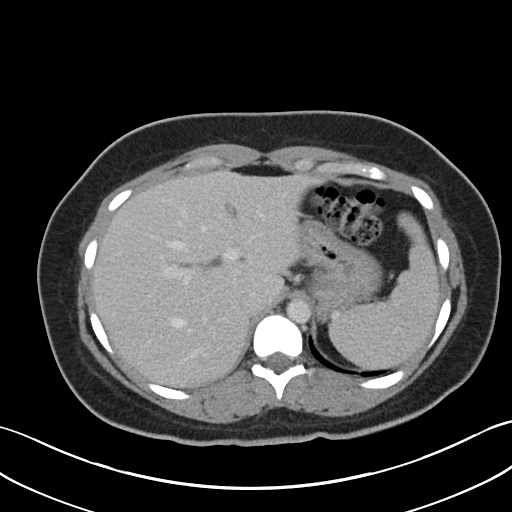
[im 95/108  soft-tissue]
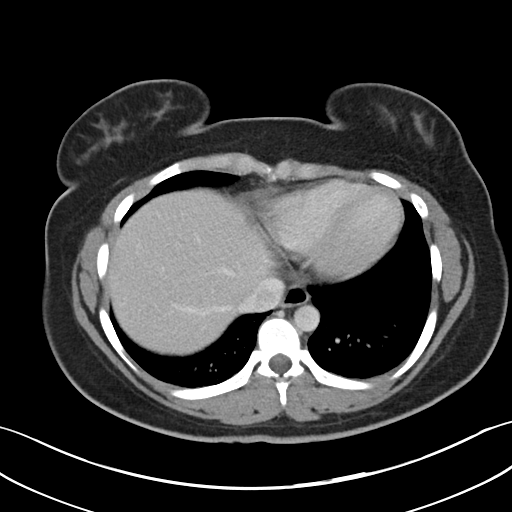
[im 103/108  soft-tissue]
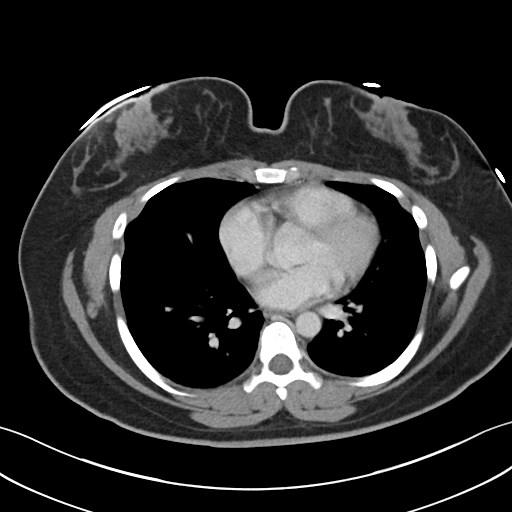

[Series 5: coronals · coronal · 0.77mm/px · 3 of 138 slices shown]
[im 46/138  soft-tissue]
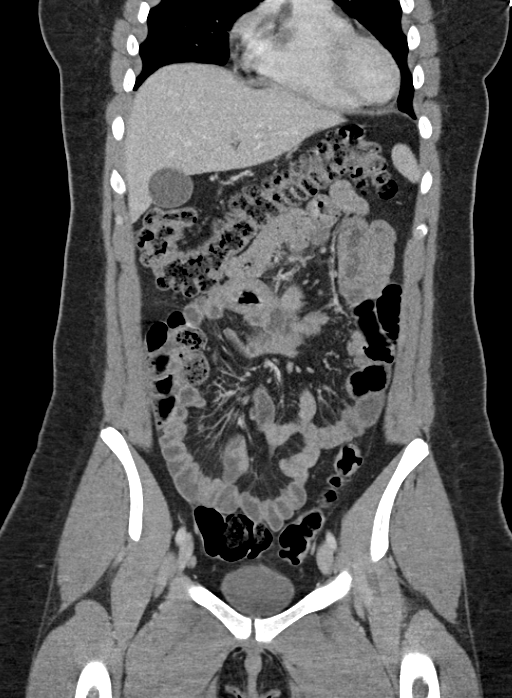
[im 61/138  soft-tissue]
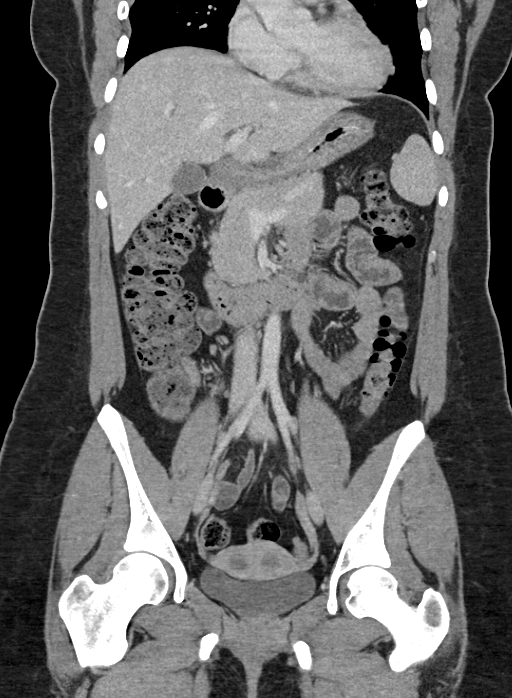
[im 77/138  soft-tissue]
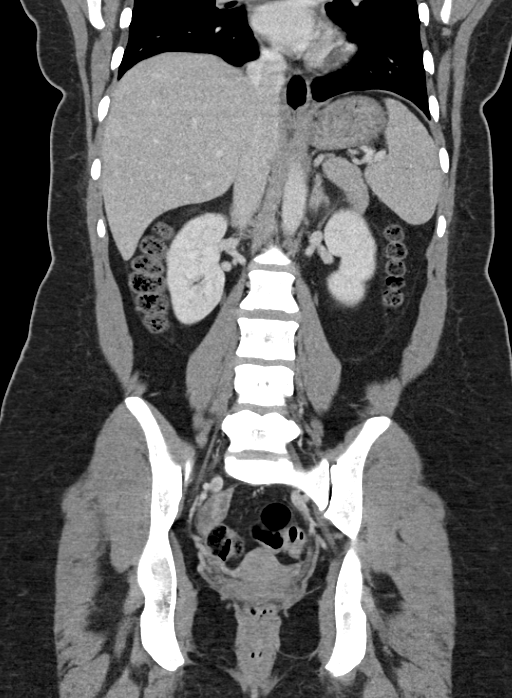

[16 of 46 positions shown; findings below may reference images not displayed]

FINDINGS: Lung bases are clear.

There is mild fatty infiltration in the liver. No focal liver
lesions are identified. The gallbladder wall is not thickened. There
is no biliary duct dilatation.

Spleen, pancreas, and adrenals appear normal.

Kidneys bilaterally show no mass or hydronephrosis. There is no
renal or ureteral calculus on either side.

In the pelvis, the urinary bladder is midline with normal wall
thickness. There is a small amount of free fluid in the cul-de-sac.
The uterus has a septate appearance with two uterine horns more
superiorly. There is no pelvic mass appreciable. Appendix region
appears normal without inflammation.

There is a small ventral hernia containing only fat.

There is no bowel obstruction. No free air or portal venous air.
There is no appreciable adenopathy or abscess in the abdomen or
pelvis. There is no demonstrable abdominal aortic aneurysm. There
are no blastic or lytic bone lesions.
IMPRESSION: No mesenteric inflammation or abscess. No appendiceal region
inflammation. No renal or ureteral calculus. No hydronephrosis.

Small amount of free fluid in cul-de-sac. Suspect recent ovarian
cyst rupture.

Evidence of a septate uterus.

Small ventral hernia containing only fat.

Mild hepatic steatosis.

## 2016-12-09 ENCOUNTER — Telehealth: Payer: Self-pay | Admitting: Internal Medicine

## 2017-05-07 ENCOUNTER — Telehealth: Payer: 59 | Admitting: Family

## 2017-05-07 DIAGNOSIS — B9689 Other specified bacterial agents as the cause of diseases classified elsewhere: Secondary | ICD-10-CM | POA: Diagnosis not present

## 2017-05-07 DIAGNOSIS — J028 Acute pharyngitis due to other specified organisms: Secondary | ICD-10-CM | POA: Diagnosis not present

## 2017-05-07 MED ORDER — AZITHROMYCIN 250 MG PO TABS: ORAL_TABLET | ORAL | 0 refills | Status: DC

## 2017-05-07 MED ORDER — PREDNISONE 5 MG PO TABS: 5.0000 mg | ORAL_TABLET | ORAL | 0 refills | Status: DC

## 2017-05-07 MED ORDER — BENZONATATE 100 MG PO CAPS: 100.0000 mg | ORAL_CAPSULE | Freq: Three times a day (TID) | ORAL | 0 refills | Status: DC | PRN

## 2017-05-07 NOTE — Progress Notes (Signed)
Thank you for the details you included in the comment boxes. Those details are very helpful in determining the best course of treatment for you and help us to provide the best care. Given that it has been 2 full weeks and has moved into your lungs, this is likely a bacterial pharyngitis/sinusitis combination. See below.  We are sorry that you are not feeling well.  Here is how we plan to help!  Based on your presentation I believe you most likely have A cough due to bacteria.  When patients have a fever and a productive cough with a change in color or increased sputum production, we are concerned about bacterial bronchitis.  If left untreated it can progress to pneumonia.  If your symptoms do not improve with your treatment plan it is important that you contact your provider.   I have prescribed Azithromyin 250 mg: two tablets now and then one tablet daily for 4 additonal days    In addition you may use A non-prescription cough medication called Mucinex DM: take 2 tablets every 12 hours. and A prescription cough medication called Tessalon Perles 100mg . You may take 1-2 capsules every 8 hours as needed for your cough.  Sterapred 5 mg dosepak  From your responses in the eVisit questionnaire you describe inflammation in the upper respiratory tract which is causing a significant cough.  This is commonly called Bronchitis and has four common causes:    Allergies  Viral Infections  Acid Reflux  Bacterial Infection Allergies, viruses and acid reflux are treated by controlling symptoms or eliminating the cause. An example might be a cough caused by taking certain blood pressure medications. You stop the cough by changing the medication. Another example might be a cough caused by acid reflux. Controlling the reflux helps control the cough.  USE OF BRONCHODILATOR ("RESCUE") INHALERS: There is a risk from using your bronchodilator too frequently.  The risk is that over-reliance on a medication which only  relaxes the muscles surrounding the breathing tubes can reduce the effectiveness of medications prescribed to reduce swelling and congestion of the tubes themselves.  Although you feel brief relief from the bronchodilator inhaler, your asthma may actually be worsening with the tubes becoming more swollen and filled with mucus.  This can delay other crucial treatments, such as oral steroid medications. If you need to use a bronchodilator inhaler daily, several times per day, you should discuss this with your provider.  There are probably better treatments that could be used to keep your asthma under control.     HOME CARE . Only take medications as instructed by your medical team. . Complete the entire course of an antibiotic. . Drink plenty of fluids and get plenty of rest. . Avoid close contacts especially the very young and the elderly . Cover your mouth if you cough or cough into your sleeve. . Always remember to wash your hands . A steam or ultrasonic humidifier can help congestion.   GET HELP RIGHT AWAY IF: . You develop worsening fever. . You become short of breath . You cough up blood. . Your symptoms persist after you have completed your treatment plan MAKE SURE YOU   Understand these instructions.  Will watch your condition.  Will get help right away if you are not doing well or get worse.  Your e-visit answers were reviewed by a board certified advanced clinical practitioner to complete your personal care plan.  Depending on the condition, your plan could have included both over  the counter or prescription medications. If there is a problem please reply  once you have received a response from your provider. Your safety is important to [place].  If you have drug allergies check your prescription carefully.    You can use MyChart to ask questions about today's visit, request a non-urgent call back, or ask for a work or school excuse for 24 hours related to this e-Visit. If it has been  greater than 24 hours you will need to follow up with your provider, or enter a new e-Visit to address those concerns. You will get an e-mail in the next two days asking about your experience.  I hope that your e-visit has been valuable and will speed your recovery. Thank you for using e-visits.

## 2017-09-07 DIAGNOSIS — O021 Missed abortion: Secondary | ICD-10-CM | POA: Diagnosis not present

## 2017-09-11 ENCOUNTER — Other Ambulatory Visit: Payer: Self-pay

## 2017-09-11 ENCOUNTER — Encounter (HOSPITAL_COMMUNITY): Payer: Self-pay | Admitting: *Deleted

## 2017-09-11 NOTE — H&P (Addendum)
Christina Costa is an 27 y.o. female. Presenting for D&E s/s/ nonviable pregnancy  Pertinent Gynecological History: Menses: flow is moderate Bleeding: n/a Contraception: none DES exposure: denies Blood transfusions: none Sexually transmitted diseases: no past history Last pap: normal Date: 2018 OB History: G1, P0   Menstrual History: Patient's last menstrual period was 07/17/2017 (exact date).    Past Medical History:  Diagnosis Date  . Headache   . Hypothyroidism   . hypothyroidisn    since age 27  . Seasonal allergies     Past Surgical History:  Procedure Laterality Date  . permanent retainers     upper and lower   . WISDOM TOOTH EXTRACTION      Family History  Problem Relation Age of Onset  . Allergy (severe) Mother   . Hypothyroidism Mother   . Arthritis Maternal Grandmother   . Heart disease Maternal Grandfather     Social History:  reports that she has never smoked. She has never used smokeless tobacco. She reports that she does not drink alcohol or use drugs.  Allergies:  Allergies  Allergen Reactions  . Gluten Meal Nausea And Vomiting    No medications prior to admission.    ROS  Height 5\' 6"  (1.676 m), weight 108.9 kg (240 lb), last menstrual period 07/17/2017. Physical Exam  Gen: well appearing, NAD CV: Reg rate Pulm: NWOB Abd: soft, nondistended, nontender, no masses GYN: uterus 6 week size, no adnexa ttp/CMT Ext: No edema b/l  No results found for this or any previous visit (from the past 24 hour(s)).  No results found.  Assessment/Plan: 27 yo w/MAB measuring 78mm w/out FHT. She was counseled on all options and ultimately decided on D&E.  Risks discussed including infection, bleeding, damage to surrounding structures, need for additional procedures and failure. All questions answered. Ho thyroid dz well c/w medication. 2g Iv doxy on call to OR. She is B+.    Christina Costa   Christina EtienneElise Jennifer Shaunak Costa Costa, 2:14 PM   No updates to  above H&P. Patient arrived NPO and was consented in PACU. Risks discussed including infection, bleeding, damage to surrounding structures, need for additional procedures. All questions answered. Consent signed. Proceed with above surgery.   Christina Costa

## 2017-09-16 ENCOUNTER — Ambulatory Visit (HOSPITAL_COMMUNITY)
Admission: AD | Admit: 2017-09-16 | Discharge: 2017-09-16 | Disposition: A | Discharge status: Home / Self Care | Payer: 59 | Source: Ambulatory Visit | Attending: Obstetrics and Gynecology | Admitting: Obstetrics and Gynecology

## 2017-09-16 ENCOUNTER — Ambulatory Visit (HOSPITAL_COMMUNITY): Payer: 59 | Admitting: Anesthesiology

## 2017-09-16 ENCOUNTER — Other Ambulatory Visit: Payer: Self-pay

## 2017-09-16 ENCOUNTER — Encounter (HOSPITAL_COMMUNITY): Payer: Self-pay

## 2017-09-16 ENCOUNTER — Encounter (HOSPITAL_COMMUNITY)
Admission: AD | Disposition: A | Discharge status: Home / Self Care | Payer: Self-pay | Source: Ambulatory Visit | Attending: Obstetrics and Gynecology

## 2017-09-16 DIAGNOSIS — E669 Obesity, unspecified: Secondary | ICD-10-CM | POA: Diagnosis not present

## 2017-09-16 DIAGNOSIS — E039 Hypothyroidism, unspecified: Secondary | ICD-10-CM | POA: Insufficient documentation

## 2017-09-16 DIAGNOSIS — Z79890 Hormone replacement therapy (postmenopausal): Secondary | ICD-10-CM | POA: Diagnosis not present

## 2017-09-16 DIAGNOSIS — O021 Missed abortion: Secondary | ICD-10-CM | POA: Insufficient documentation

## 2017-09-16 DIAGNOSIS — Z79899 Other long term (current) drug therapy: Secondary | ICD-10-CM | POA: Diagnosis not present

## 2017-09-16 DIAGNOSIS — Z6838 Body mass index (BMI) 38.0-38.9, adult: Secondary | ICD-10-CM | POA: Insufficient documentation

## 2017-09-16 HISTORY — PX: DILATION AND EVACUATION: SHX1459

## 2017-09-16 HISTORY — DX: Headache: R51

## 2017-09-16 HISTORY — DX: Headache, unspecified: R51.9

## 2017-09-16 HISTORY — DX: Other seasonal allergic rhinitis: J30.2

## 2017-09-16 HISTORY — DX: Hypothyroidism, unspecified: E03.9

## 2017-09-16 LAB — CBC
HCT: 39.9 % (ref 36.0–46.0)
Hemoglobin: 13.9 g/dL (ref 12.0–15.0)
MCH: 28.9 pg (ref 26.0–34.0)
MCHC: 34.8 g/dL (ref 30.0–36.0)
MCV: 83 fL (ref 78.0–100.0)
Platelets: 224 10*3/uL (ref 150–400)
RBC: 4.81 MIL/uL (ref 3.87–5.11)
RDW: 13.2 % (ref 11.5–15.5)
WBC: 9.1 10*3/uL (ref 4.0–10.5)

## 2017-09-16 LAB — TYPE AND SCREEN
ABO/RH(D): B POS
Antibody Screen: NEGATIVE

## 2017-09-16 LAB — ABO/RH: ABO/RH(D): B POS

## 2017-09-16 SURGERY — DILATION AND EVACUATION, UTERUS
Anesthesia: Monitor Anesthesia Care | Site: Vagina

## 2017-09-16 MED ORDER — DEXAMETHASONE SODIUM PHOSPHATE 10 MG/ML IJ SOLN
INTRAMUSCULAR | Status: DC | PRN
  Administered 2017-09-16: 4 mg via INTRAVENOUS

## 2017-09-16 MED ORDER — SILVER NITRATE-POT NITRATE 75-25 % EX MISC
CUTANEOUS | Status: AC
  Filled 2017-09-16: qty 1

## 2017-09-16 MED ORDER — ONDANSETRON HCL 4 MG/2ML IJ SOLN
INTRAMUSCULAR | Status: AC
  Filled 2017-09-16: qty 2

## 2017-09-16 MED ORDER — LIDOCAINE HCL (CARDIAC) PF 100 MG/5ML IV SOSY
PREFILLED_SYRINGE | INTRAVENOUS | Status: DC | PRN
  Administered 2017-09-16: 100 mg via INTRAVENOUS

## 2017-09-16 MED ORDER — KETOROLAC TROMETHAMINE 30 MG/ML IJ SOLN
INTRAMUSCULAR | Status: DC | PRN
  Administered 2017-09-16: 30 mg via INTRAVENOUS

## 2017-09-16 MED ORDER — MIDAZOLAM HCL 2 MG/2ML IJ SOLN
INTRAMUSCULAR | Status: AC
  Filled 2017-09-16: qty 2

## 2017-09-16 MED ORDER — SCOPOLAMINE 1 MG/3DAYS TD PT72
MEDICATED_PATCH | TRANSDERMAL | Status: AC
  Administered 2017-09-16: 1.5 mg via TRANSDERMAL
  Filled 2017-09-16: qty 1

## 2017-09-16 MED ORDER — CHLOROPROCAINE HCL 1 % IJ SOLN
INTRAMUSCULAR | Status: AC
  Filled 2017-09-16: qty 30

## 2017-09-16 MED ORDER — ONDANSETRON HCL 4 MG/2ML IJ SOLN
INTRAMUSCULAR | Status: DC | PRN
  Administered 2017-09-16: 4 mg via INTRAVENOUS

## 2017-09-16 MED ORDER — PROMETHAZINE HCL 25 MG/ML IJ SOLN: 6.2500 mg | INTRAMUSCULAR | Status: DC | PRN

## 2017-09-16 MED ORDER — MIDAZOLAM HCL 2 MG/2ML IJ SOLN
INTRAMUSCULAR | Status: DC | PRN
  Administered 2017-09-16: 2 mg via INTRAVENOUS

## 2017-09-16 MED ORDER — DEXAMETHASONE SODIUM PHOSPHATE 4 MG/ML IJ SOLN
INTRAMUSCULAR | Status: AC
  Filled 2017-09-16: qty 1

## 2017-09-16 MED ORDER — PROPOFOL 500 MG/50ML IV EMUL
INTRAVENOUS | Status: DC | PRN
  Administered 2017-09-16: 100 ug/kg/min via INTRAVENOUS

## 2017-09-16 MED ORDER — CHLOROPROCAINE HCL 1 % IJ SOLN
INTRAMUSCULAR | Status: DC | PRN
  Administered 2017-09-16: 10 mL

## 2017-09-16 MED ORDER — 0.9 % SODIUM CHLORIDE (POUR BTL) OPTIME
TOPICAL | Status: DC | PRN
  Administered 2017-09-16: 1000 mL

## 2017-09-16 MED ORDER — FENTANYL CITRATE (PF) 100 MCG/2ML IJ SOLN
INTRAMUSCULAR | Status: AC
  Filled 2017-09-16: qty 2

## 2017-09-16 MED ORDER — LIDOCAINE HCL (CARDIAC) PF 100 MG/5ML IV SOSY
PREFILLED_SYRINGE | INTRAVENOUS | Status: AC
  Filled 2017-09-16: qty 5

## 2017-09-16 MED ORDER — SCOPOLAMINE 1 MG/3DAYS TD PT72
1.0000 | MEDICATED_PATCH | Freq: Once | TRANSDERMAL | Status: DC
  Administered 2017-09-16: 1.5 mg via TRANSDERMAL

## 2017-09-16 MED ORDER — KETOROLAC TROMETHAMINE 30 MG/ML IJ SOLN
INTRAMUSCULAR | Status: AC
  Filled 2017-09-16: qty 1

## 2017-09-16 MED ORDER — LACTATED RINGERS IV SOLN
INTRAVENOUS | Status: DC
  Administered 2017-09-16: 125 mL/h via INTRAVENOUS

## 2017-09-16 MED ORDER — FENTANYL CITRATE (PF) 100 MCG/2ML IJ SOLN
INTRAMUSCULAR | Status: DC | PRN
  Administered 2017-09-16: 100 ug via INTRAVENOUS

## 2017-09-16 MED ORDER — DOXYCYCLINE HYCLATE 100 MG IV SOLR
200.0000 mg | Freq: Once | INTRAVENOUS | Status: AC
  Administered 2017-09-16: 200 mg via INTRAVENOUS
  Filled 2017-09-16: qty 200

## 2017-09-16 MED ORDER — PROPOFOL 10 MG/ML IV BOLUS
INTRAVENOUS | Status: AC
  Filled 2017-09-16: qty 20

## 2017-09-16 MED ORDER — FENTANYL CITRATE (PF) 100 MCG/2ML IJ SOLN: 25.0000 ug | INTRAMUSCULAR | Status: DC | PRN

## 2017-09-16 SURGICAL SUPPLY — 18 items
CATH ROBINSON RED A/P 16FR (CATHETERS) ×3 IMPLANT
DECANTER SPIKE VIAL GLASS SM (MISCELLANEOUS) ×3 IMPLANT
GLOVE BIO SURGEON STRL SZ 6.5 (GLOVE) ×2 IMPLANT
GLOVE BIO SURGEONS STRL SZ 6.5 (GLOVE) ×1
GLOVE BIOGEL PI IND STRL 7.0 (GLOVE) ×2 IMPLANT
GLOVE BIOGEL PI INDICATOR 7.0 (GLOVE) ×4
GOWN STRL REUS W/TWL LRG LVL3 (GOWN DISPOSABLE) ×6 IMPLANT
KIT BERKELEY 1ST TRIMESTER 3/8 (MISCELLANEOUS) ×3 IMPLANT
NS IRRIG 1000ML POUR BTL (IV SOLUTION) ×3 IMPLANT
PACK VAGINAL MINOR WOMEN LF (CUSTOM PROCEDURE TRAY) ×3 IMPLANT
PAD OB MATERNITY 4.3X12.25 (PERSONAL CARE ITEMS) ×3 IMPLANT
PAD PREP 24X48 CUFFED NSTRL (MISCELLANEOUS) ×3 IMPLANT
SET BERKELEY SUCTION TUBING (SUCTIONS) ×3 IMPLANT
TOWEL OR 17X24 6PK STRL BLUE (TOWEL DISPOSABLE) ×6 IMPLANT
VACURETTE 10 RIGID CVD (CANNULA) IMPLANT
VACURETTE 7MM CVD STRL WRAP (CANNULA) ×3 IMPLANT
VACURETTE 8 RIGID CVD (CANNULA) IMPLANT
VACURETTE 9 RIGID CVD (CANNULA) IMPLANT

## 2017-09-16 NOTE — Op Note (Signed)
PREOPERATIVE DIAGNOSES: 1. Missed Abortion  POSTOPERATIVE DIAGNOSES: Same  PROCEDURE PERFORMED: Dilation, suction, sharp curretage  SURGEON: Dr. Elise Leger  ANESTHESIA: Paracervical block and IV sedation  ESTIMATED BLOOD LOSS: 50cc.  COMPLICATIONS: None  TUBES: None.  DRAINS: None  PATHOLOGY: Endometrial curettings   FINDINGS: On exam, under anesthesia, normal appearing vulva and vagina, 7 week sized uterus  Operative findings demonstrated plethora of POCs  Procedure: The patient was taken to the operating room where she was properly prepped and draped in sterile manner under general anesthesia. After bimanual examination, the cervix was exposed with a speculum and the anterior lip of the cervix grasped with a tenaculum. Paracervical block performed. The endocervical canal was then progressively dilated to 7mm. Suction catheter was introduced into the uterus and to the uterine fundus. The uterus was evacuated and good tissue return was noted. A sharp curettage was then performed until gritty texture noted. All instruments were removed from vagina. The sponge and lap counts were correct times 2 at this time. The patient's procedure was terminated. We then awakened her. She was sent to the Recovery Room in good condition.    Elise Leger MD  

## 2017-09-16 NOTE — Anesthesia Postprocedure Evaluation (Signed)
Anesthesia Post Note  Patient: Angelice Piech  Procedure(s) Performed: DILATATION AND EVACUATION (N/A Vagina )     Patient location during evaluation: PACU Anesthesia Type: MAC Level of consciousness: awake and alert Pain management: pain level controlled Vital Signs Assessment: post-procedure vital signs reviewed and stable Respiratory status: spontaneous breathing, nonlabored ventilation and respiratory function stable Cardiovascular status: stable and blood pressure returned to baseline Postop Assessment: no apparent nausea or vomiting Anesthetic complications: no    Last Vitals:  Vitals:   09/16/17 0905 09/16/17 0945  BP:    Pulse:    Resp:  16  Temp: 36.6 C   SpO2:      Last Pain:  Vitals:   09/16/17 0945  TempSrc:   PainSc: 0-No pain   Pain Goal: Patients Stated Pain Goal: 3 (09/16/17 0759)               Catalina Gravel

## 2017-09-16 NOTE — Brief Op Note (Signed)
09/16/2017  11:32 AM  PATIENT:  Christina Costa  27 y.o. female  PRE-OPERATIVE DIAGNOSIS:  missed ab  POST-OPERATIVE DIAGNOSIS:  missed ab  PROCEDURE:  Procedure(s): DILATATION AND EVACUATION (N/A)  SURGEON:  Surgeon(s) and Role:    * Jamine Highfill, Madelaine EtienneElise Jennifer, MD - Primary  PHYSICIAN ASSISTANT:   ASSISTANTS: none   ANESTHESIA:   IV sedation  EBL:  50 mL   BLOOD ADMINISTERED:none  DRAINS: none   LOCAL MEDICATIONS USED:  MARCAINE    and Amount: 10 ml  SPECIMEN:  Source of Specimen:  endometrial currettings  DISPOSITION OF SPECIMEN:  PATHOLOGY  COUNTS:  YES  TOURNIQUET:  * No tourniquets in log *  DICTATION: .Note written in EPIC  PLAN OF CARE: Discharge to home after PACU  PATIENT DISPOSITION:  PACU - hemodynamically stable.   Delay start of Pharmacological VTE agent (>24hrs) due to surgical blood loss or risk of bleeding: not applicable

## 2017-09-16 NOTE — Anesthesia Preprocedure Evaluation (Signed)
Anesthesia Evaluation  Patient identified by MRN, date of birth, ID band Patient awake    Reviewed: Allergy & Precautions, NPO status , Patient's Chart, lab work & pertinent test results  Airway Mallampati: II  TM Distance: >3 FB Neck ROM: Full    Dental  (+) Teeth Intact, Dental Advisory Given   Pulmonary neg pulmonary ROS,    Pulmonary exam normal breath sounds clear to auscultation       Cardiovascular negative cardio ROS Normal cardiovascular exam Rhythm:Regular Rate:Normal     Neuro/Psych negative neurological ROS     GI/Hepatic negative GI ROS, Neg liver ROS,   Endo/Other  Hypothyroidism Obesity   Renal/GU negative Renal ROS     Musculoskeletal negative musculoskeletal ROS (+)   Abdominal   Peds  Hematology negative hematology ROS (+)   Anesthesia Other Findings Day of surgery medications reviewed with the patient.  Reproductive/Obstetrics 8wk missed AB                             Anesthesia Physical Anesthesia Plan  ASA: II  Anesthesia Plan: MAC   Post-op Pain Management:    Induction: Intravenous  PONV Risk Score and Plan: 2 and Propofol infusion, Ondansetron and Midazolam  Airway Management Planned: Natural Airway and Simple Face Mask  Additional Equipment:   Intra-op Plan:   Post-operative Plan:   Informed Consent: I have reviewed the patients History and Physical, chart, labs and discussed the procedure including the risks, benefits and alternatives for the proposed anesthesia with the patient or authorized representative who has indicated his/her understanding and acceptance.   Dental advisory given  Plan Discussed with: CRNA  Anesthesia Plan Comments:         Anesthesia Quick Evaluation

## 2017-09-16 NOTE — Transfer of Care (Signed)
Immediate Anesthesia Transfer of Care Note  Patient: Christina Costa  Procedure(s) Performed: DILATATION AND EVACUATION (N/A Vagina )  Patient Location: PACU  Anesthesia Type:MAC  Level of Consciousness: awake, alert  and oriented  Airway & Oxygen Therapy: Patient Spontanous Breathing  Post-op Assessment: Report given to RN and Post -op Vital signs reviewed and stable  Post vital signs: Reviewed and stable  Last Vitals:  Vitals Value Taken Time  BP    Temp    Pulse 74 09/16/2017  9:06 AM  Resp 14 09/16/2017  9:06 AM  SpO2 98 % 09/16/2017  9:06 AM  Vitals shown include unvalidated device data.  Last Pain:  Vitals:   09/16/17 0759  TempSrc: Oral  PainSc: 0-No pain      Patients Stated Pain Goal: 3 (66/44/03 4742)  Complications: No apparent anesthesia complications

## 2017-09-16 NOTE — Discharge Instructions (Signed)
°  Post Anesthesia Home Care Instructions ° °Activity: °Get plenty of rest for the remainder of the day. A responsible individual must stay with you for 24 hours following the procedure.  °For the next 24 hours, DO NOT: °-Drive a car °-Operate machinery °-Drink alcoholic beverages °-Take any medication unless instructed by your physician °-Make any legal decisions or sign important papers. ° °Meals: °Start with liquid foods such as gelatin or soup. Progress to regular foods as tolerated. Avoid greasy, spicy, heavy foods. If nausea and/or vomiting occur, drink only clear liquids until the nausea and/or vomiting subsides. Call your physician if vomiting continues. ° °Special Instructions/Symptoms: °Your throat may feel dry or sore from the anesthesia or the breathing tube placed in your throat during surgery. If this causes discomfort, gargle with warm salt water. The discomfort should disappear within 24 hours. ° °If you had a scopolamine patch placed behind your ear for the management of post- operative nausea and/or vomiting: ° °1. The medication in the patch is effective for 72 hours, after which it should be removed.  Wrap patch in a tissue and discard in the trash. Wash hands thoroughly with soap and water. °2. You may remove the patch earlier than 72 hours if you experience unpleasant side effects which may include dry mouth, dizziness or visual disturbances. °3. Avoid touching the patch. Wash your hands with soap and water after contact with the patch. °  °DISCHARGE INSTRUCTIONS: D&C / D&E °The following instructions have been prepared to help you care for yourself upon your return home. °  °Personal hygiene: °• Use sanitary pads for vaginal drainage, not tampons. °• Shower the day after your procedure. °• NO tub baths, pools or Jacuzzis for 2-3 weeks. °• Wipe front to back after using the bathroom. ° °Activity and limitations: °• Do NOT drive or operate any equipment for 24 hours. The effects of anesthesia are  still present and drowsiness may result. °• Do NOT rest in bed all day. °• Walking is encouraged. °• Walk up and down stairs slowly. °• You may resume your normal activity in one to two days or as indicated by your physician. ° °Sexual activity: NO intercourse for at least 2 weeks after the procedure, or as indicated by your physician. ° °Diet: Eat a light meal as desired this evening. You may resume your usual diet tomorrow. ° °Return to work: You may resume your work activities in one to two days or as indicated by your doctor. ° °What to expect after your surgery: Expect to have vaginal bleeding/discharge for 2-3 days and spotting for up to 10 days. It is not unusual to have soreness for up to 1-2 weeks. You may have a slight burning sensation when you urinate for the first day. Mild cramps may continue for a couple of days. You may have a regular period in 2-6 weeks. ° °Call your doctor for any of the following: °• Excessive vaginal bleeding, saturating and changing one pad every hour. °• Inability to urinate 6 hours after discharge from hospital. °• Pain not relieved by pain medication. °• Fever of 100.4° F or greater. °• Unusual vaginal discharge or odor. ° ° Call for an appointment:  ° ° °Patient’s signature: ______________________ ° °Nurse’s signature ________________________ ° °Support person's signature_______________________ ° ° ° °

## 2017-09-17 ENCOUNTER — Encounter (HOSPITAL_COMMUNITY): Payer: Self-pay | Admitting: Obstetrics and Gynecology

## 2017-09-18 ENCOUNTER — Inpatient Hospital Stay (HOSPITAL_COMMUNITY)
Admission: AD | Admit: 2017-09-18 | Discharge: 2017-09-19 | Disposition: A | Discharge status: Home / Self Care | Payer: 59 | Source: Ambulatory Visit | Attending: Obstetrics and Gynecology | Admitting: Obstetrics and Gynecology

## 2017-09-18 DIAGNOSIS — G8918 Other acute postprocedural pain: Secondary | ICD-10-CM | POA: Insufficient documentation

## 2017-09-18 DIAGNOSIS — R102 Pelvic and perineal pain: Secondary | ICD-10-CM | POA: Diagnosis present

## 2017-09-18 LAB — URINALYSIS, ROUTINE W REFLEX MICROSCOPIC
Bilirubin Urine: NEGATIVE
Glucose, UA: NEGATIVE mg/dL
Ketones, ur: NEGATIVE mg/dL
Nitrite: NEGATIVE
Protein, ur: NEGATIVE mg/dL
Specific Gravity, Urine: 1.023 (ref 1.005–1.030)
pH: 5 (ref 5.0–8.0)

## 2017-09-18 NOTE — MAU Note (Signed)
Had D&C Weds for SAB. Awoke this am with shooting pain in rectum. Now still having that some but also pain LLQ that radiates to L flank. MOvement makes it worse, esp sitting. HAving some scant vag bleeding

## 2017-09-19 ENCOUNTER — Other Ambulatory Visit: Payer: Self-pay | Admitting: Student

## 2017-09-19 ENCOUNTER — Encounter (HOSPITAL_COMMUNITY): Payer: Self-pay | Admitting: *Deleted

## 2017-09-19 DIAGNOSIS — G8918 Other acute postprocedural pain: Secondary | ICD-10-CM

## 2017-09-19 LAB — CBC WITH DIFFERENTIAL/PLATELET
Basophils Absolute: 0 10*3/uL (ref 0.0–0.1)
Basophils Relative: 0 %
Eosinophils Absolute: 0.1 10*3/uL (ref 0.0–0.7)
Eosinophils Relative: 1 %
HCT: 33.4 % — ABNORMAL LOW (ref 36.0–46.0)
Hemoglobin: 11.9 g/dL — ABNORMAL LOW (ref 12.0–15.0)
Lymphocytes Relative: 16 %
Lymphs Abs: 1.6 10*3/uL (ref 0.7–4.0)
MCH: 29.8 pg (ref 26.0–34.0)
MCHC: 35.6 g/dL (ref 30.0–36.0)
MCV: 83.5 fL (ref 78.0–100.0)
Monocytes Absolute: 0.5 10*3/uL (ref 0.1–1.0)
Monocytes Relative: 5 %
Neutro Abs: 7.7 10*3/uL (ref 1.7–7.7)
Neutrophils Relative %: 78 %
Platelets: 184 10*3/uL (ref 150–400)
RBC: 4 MIL/uL (ref 3.87–5.11)
RDW: 13.4 % (ref 11.5–15.5)
WBC: 9.9 10*3/uL (ref 4.0–10.5)

## 2017-09-19 MED ORDER — HYDROMORPHONE HCL 2 MG PO TABS: 2.0000 mg | ORAL_TABLET | ORAL | 0 refills | Status: DC | PRN

## 2017-09-19 MED ORDER — KETOROLAC TROMETHAMINE 60 MG/2ML IM SOLN
60.0000 mg | Freq: Once | INTRAMUSCULAR | Status: AC
  Administered 2017-09-19: 60 mg via INTRAMUSCULAR
  Filled 2017-09-19: qty 2

## 2017-09-19 MED ORDER — OXYTOCIN 40 UNITS IN LACTATED RINGERS INFUSION - SIMPLE MED
INTRAVENOUS | Status: AC
  Filled 2017-09-19: qty 1000

## 2017-09-19 NOTE — Progress Notes (Signed)
Rx for Dilaudid 2 mg 1 po q4 hrs Disp #12 sent to [facility] [address] [place] per pharmacist at [facility]. [facility] did not [facility] the quantity of medication Rx'd for patient and could not get any in stock until Wednesday 09/23/2017.  Raelyn MoraRolitta Alix Lahmann, CNM  09/19/2017 3:20 PM

## 2017-09-19 NOTE — MAU Provider Note (Signed)
History   [id number]   Chief Complaint  Patient presents with  . Pelvic Pain    HPI Christina Costa is a 27 y.o. female  G1P0 here with report of shooting pain in rectum.  Pain is rated a 7/10.  Pt is status-post a D&C for missed abortion on 09/16/17.  Also reports pain in LLQ that radiates to left flank.  Movement aggravates the pain.  Also reports scant vaginal bleeding.    Patient's last menstrual period was 07/17/2017 (exact date).  OB History  Gravida Para Term Preterm AB Living  1            SAB TAB Ectopic Multiple Live Births               # Outcome Date GA Lbr Len/2nd Weight Sex Delivery Anes PTL Lv  1 Current             Past Medical History:  Diagnosis Date  . Headache   . Hypothyroidism   . hypothyroidisn    since age 43  . Seasonal allergies     Family History  Problem Relation Age of Onset  . Allergy (severe) Mother   . Hypothyroidism Mother   . Arthritis Maternal Grandmother   . Heart disease Maternal Grandfather     Social History   Socioeconomic History  . Marital status: Married    Spouse name: Not on file  . Number of children: Not on file  . Years of education: Not on file  . Highest education level: Not on file  Occupational History  . Not on file  Social Needs  . Financial resource strain: Not on file  . Food insecurity:    Worry: Not on file    Inability: Not on file  . Transportation needs:    Medical: Not on file    Non-medical: Not on file  Tobacco Use  . Smoking status: Never Smoker  . Smokeless tobacco: Never Used  Substance and Sexual Activity  . Alcohol use: No  . Drug use: No  . Sexual activity: Yes    Birth control/protection: None    Comment: approx [redacted] wks gestation  Lifestyle  . Physical activity:    Days per week: Not on file    Minutes per session: Not on file  . Stress: Not on file  Relationships  . Social connections:    Talks on phone: Not on file    Gets together: Not on file    Attends religious service:  Not on file    Active member of club or organization: Not on file    Attends meetings of clubs or organizations: Not on file    Relationship status: Not on file  Other Topics Concern  . Not on file  Social History Narrative  . Not on file    Allergies  Allergen Reactions  . Gluten Meal Nausea And Vomiting    No current facility-administered medications on file prior to encounter.    Current Outpatient Medications on File Prior to Encounter  Medication Sig Dispense Refill  . acetaminophen (TYLENOL) 500 MG tablet Take 500 mg by mouth every 6 (six) hours as needed.    . Cholecalciferol (VITAMIN D3) 2000 units TABS Take 2,000 Units by mouth daily.    Marland Kitchen ibuprofen (ADVIL,MOTRIN) 200 MG tablet Take 400 mg by mouth every 6 (six) hours as needed.    Marland Kitchen levothyroxine (SYNTHROID, LEVOTHROID) 100 MCG tablet Take 1 tablet (100 mcg total) by mouth daily. (Patient taking  differently: Take 75 mcg by mouth daily before breakfast. ) 90 tablet 3  . liothyronine (CYTOMEL) 5 MCG tablet Take 5 mcg by mouth daily.  0  . Omega 3-6-9 Fatty Acids (TRIPLE OMEGA COMPLEX PO) Take 4 capsules by mouth daily.    Marland Kitchen. POTASSIUM IODIDE PO Take 1 tablet by mouth daily.    . vitamin B-12 (CYANOCOBALAMIN) 1000 MCG tablet Take 1,000 mcg by mouth daily.    . vitamin C (ASCORBIC ACID) 500 MG tablet Take 500 mg by mouth daily.    Marland Kitchen. azithromycin (ZITHROMAX) 250 MG tablet Take 2 tabs now then 1 daily times 4 days 6 tablet 0  . benzonatate (TESSALON PERLES) 100 MG capsule Take 1-2 capsules (100-200 mg total) by mouth every 8 (eight) hours as needed for cough. (Patient not taking: Reported on 09/11/2017) 30 capsule 0  . cyclobenzaprine (FLEXERIL) 5 MG tablet Take 1 tablet (5 mg total) by mouth 3 (three) times daily as needed for muscle spasms. (Patient not taking: Reported on 09/11/2017) 20 tablet 0  . predniSONE (DELTASONE) 5 MG tablet Take 1 tablet (5 mg total) by mouth as directed. Taper 6,5,4,3,2,1 (Patient not taking: Reported on  09/11/2017) 21 tablet 0  . promethazine (PHENERGAN) 25 MG tablet Take 1 tablet (25 mg total) by mouth every 6 (six) hours as needed for nausea or vomiting. (Patient not taking: Reported on 09/11/2017) 10 tablet 0     Review of Systems  Constitutional: Negative for chills and fever.  Gastrointestinal: Positive for nausea. Negative for abdominal pain and vomiting.  Genitourinary: Positive for flank pain, pelvic pain and vaginal bleeding. Negative for dysuria, hematuria and vaginal pain. Decreased urine volume: scant.  All other systems reviewed and are negative.    Physical Exam   Vitals:   09/18/17 2248  BP: 120/79  Pulse: 95  Resp: 20  Temp: 98.2 F (36.8 C)  Weight: 110.7 kg  Height: 5\' 6"  (1.676 m)    Physical Exam  Constitutional: She is oriented to person, place, and time. She appears well-developed and well-nourished. No distress.  HENT:  Head: Normocephalic.  Neck: Normal range of motion. Neck supple.  Cardiovascular: Normal rate, regular rhythm and normal heart sounds.  Respiratory: Effort normal and breath sounds normal. No respiratory distress.  GI: Soft. She exhibits no mass. There is tenderness (across lower abdominal region with palpation). There is no rebound and no guarding.  Genitourinary: There is bleeding (scant) in the vagina.  Musculoskeletal: Normal range of motion.  Neurological: She is alert and oriented to person, place, and time.  Skin: Skin is warm and dry.    MAU Course  Procedures  MDM Results for orders placed or performed during the hospital encounter of 09/18/17 (from the past 24 hour(s))  Urinalysis, Routine w reflex microscopic     Status: Abnormal   Collection Time: 09/18/17 11:11 PM  Result Value Ref Range   Color, Urine YELLOW YELLOW   APPearance HAZY (A) CLEAR   Specific Gravity, Urine 1.023 1.005 - 1.030   pH 5.0 5.0 - 8.0   Glucose, UA NEGATIVE NEGATIVE mg/dL   Hgb urine dipstick LARGE (A) NEGATIVE   Bilirubin Urine NEGATIVE  NEGATIVE   Ketones, ur NEGATIVE NEGATIVE mg/dL   Protein, ur NEGATIVE NEGATIVE mg/dL   Nitrite NEGATIVE NEGATIVE   Leukocytes, UA TRACE (A) NEGATIVE   RBC / HPF 0-5 0 - 5 RBC/hpf   WBC, UA 11-20 0 - 5 WBC/hpf   Bacteria, UA RARE (A) NONE SEEN  Squamous Epithelial / LPF 0-5 0 - 5   Mucus PRESENT   CBC with Differential     Status: Abnormal   Collection Time: 09/19/17  1:42 AM  Result Value Ref Range   WBC 9.9 4.0 - 10.5 K/uL   RBC 4.00 3.87 - 5.11 MIL/uL   Hemoglobin 11.9 (L) 12.0 - 15.0 g/dL   HCT 16.1 (L) 09.6 - 04.5 %   MCV 83.5 78.0 - 100.0 fL   MCH 29.8 26.0 - 34.0 pg   MCHC 35.6 30.0 - 36.0 g/dL   RDW 40.9 81.1 - 91.4 %   Platelets 184 150 - 400 K/uL   Neutrophils Relative % 78 %   Neutro Abs 7.7 1.7 - 7.7 K/uL   Lymphocytes Relative 16 %   Lymphs Abs 1.6 0.7 - 4.0 K/uL   Monocytes Relative 5 %   Monocytes Absolute 0.5 0.1 - 1.0 K/uL   Eosinophils Relative 1 %   Eosinophils Absolute 0.1 0.0 - 0.7 K/uL   Basophils Relative 0 %   Basophils Absolute 0.0 0.0 - 0.1 K/uL   0201 Reviewed lab results with Dr. Marcelle Overlie > may discharge home with pain meds if experiences relief with Toradol 0245 Pt reports pain has decreased to a 2/10  Assessment and Plan  Post-op Pain  Plan: Discharge home RX Dilaudid 2 mg #12 Keep scheduled follow-up appt for Tuesday Return for worsening of symptoms   Marlis Edelson, CNM 09/19/2017 2:47 AM

## 2017-09-22 DIAGNOSIS — R102 Pelvic and perineal pain: Secondary | ICD-10-CM | POA: Diagnosis not present

## 2017-09-22 DIAGNOSIS — O200 Threatened abortion: Secondary | ICD-10-CM | POA: Diagnosis not present

## 2017-10-02 DIAGNOSIS — Q51818 Other congenital malformations of uterus: Secondary | ICD-10-CM | POA: Diagnosis not present

## 2017-10-02 DIAGNOSIS — O021 Missed abortion: Secondary | ICD-10-CM | POA: Diagnosis not present

## 2017-10-05 ENCOUNTER — Other Ambulatory Visit: Payer: Self-pay | Admitting: Obstetrics and Gynecology

## 2017-10-05 ENCOUNTER — Other Ambulatory Visit (HOSPITAL_COMMUNITY): Payer: Self-pay | Admitting: Obstetrics and Gynecology

## 2017-10-05 DIAGNOSIS — Q5128 Other doubling of uterus, other specified: Secondary | ICD-10-CM

## 2017-10-05 DIAGNOSIS — Q512 Other doubling of uterus, unspecified: Principal | ICD-10-CM

## 2017-10-08 ENCOUNTER — Ambulatory Visit: Payer: 59

## 2017-10-09 ENCOUNTER — Ambulatory Visit
Admission: RE | Admit: 2017-10-09 | Discharge: 2017-10-09 | Disposition: A | Discharge status: Home / Self Care | Payer: 59 | Source: Ambulatory Visit | Attending: Obstetrics and Gynecology | Admitting: Obstetrics and Gynecology

## 2017-10-09 DIAGNOSIS — N2889 Other specified disorders of kidney and ureter: Secondary | ICD-10-CM | POA: Diagnosis not present

## 2017-10-09 DIAGNOSIS — Q512 Other doubling of uterus, unspecified: Secondary | ICD-10-CM | POA: Insufficient documentation

## 2017-10-09 DIAGNOSIS — Q5128 Other doubling of uterus, other specified: Secondary | ICD-10-CM

## 2017-11-19 DIAGNOSIS — N912 Amenorrhea, unspecified: Secondary | ICD-10-CM | POA: Diagnosis not present

## 2017-11-23 DIAGNOSIS — N912 Amenorrhea, unspecified: Secondary | ICD-10-CM | POA: Diagnosis not present

## 2017-11-26 DIAGNOSIS — N912 Amenorrhea, unspecified: Secondary | ICD-10-CM | POA: Diagnosis not present

## 2017-12-10 DIAGNOSIS — N911 Secondary amenorrhea: Secondary | ICD-10-CM | POA: Diagnosis not present

## 2017-12-18 DIAGNOSIS — N911 Secondary amenorrhea: Secondary | ICD-10-CM | POA: Diagnosis not present

## 2017-12-25 DIAGNOSIS — Z3482 Encounter for supervision of other normal pregnancy, second trimester: Secondary | ICD-10-CM | POA: Diagnosis not present

## 2017-12-25 DIAGNOSIS — Z3401 Encounter for supervision of normal first pregnancy, first trimester: Secondary | ICD-10-CM | POA: Diagnosis not present

## 2017-12-25 DIAGNOSIS — Z13228 Encounter for screening for other metabolic disorders: Secondary | ICD-10-CM | POA: Diagnosis not present

## 2017-12-25 DIAGNOSIS — Z3685 Encounter for antenatal screening for Streptococcus B: Secondary | ICD-10-CM | POA: Diagnosis not present

## 2017-12-25 DIAGNOSIS — Z3481 Encounter for supervision of other normal pregnancy, first trimester: Secondary | ICD-10-CM | POA: Diagnosis not present

## 2017-12-25 LAB — OB RESULTS CONSOLE GC/CHLAMYDIA
Chlamydia: NEGATIVE
Gonorrhea: NEGATIVE

## 2017-12-25 LAB — OB RESULTS CONSOLE HEPATITIS B SURFACE ANTIGEN: Hepatitis B Surface Ag: NEGATIVE

## 2017-12-25 LAB — OB RESULTS CONSOLE RPR: RPR: NONREACTIVE

## 2017-12-25 LAB — OB RESULTS CONSOLE HIV ANTIBODY (ROUTINE TESTING): HIV: NONREACTIVE

## 2017-12-25 LAB — OB RESULTS CONSOLE RUBELLA ANTIBODY, IGM: Rubella: IMMUNE

## 2018-01-01 DIAGNOSIS — Z3481 Encounter for supervision of other normal pregnancy, first trimester: Secondary | ICD-10-CM | POA: Diagnosis not present

## 2018-01-01 DIAGNOSIS — Z3480 Encounter for supervision of other normal pregnancy, unspecified trimester: Secondary | ICD-10-CM | POA: Diagnosis not present

## 2018-01-01 DIAGNOSIS — Z3482 Encounter for supervision of other normal pregnancy, second trimester: Secondary | ICD-10-CM | POA: Diagnosis not present

## 2018-01-01 DIAGNOSIS — Z113 Encounter for screening for infections with a predominantly sexual mode of transmission: Secondary | ICD-10-CM | POA: Diagnosis not present

## 2018-01-01 DIAGNOSIS — Z124 Encounter for screening for malignant neoplasm of cervix: Secondary | ICD-10-CM | POA: Diagnosis not present

## 2018-01-01 DIAGNOSIS — Z331 Pregnant state, incidental: Secondary | ICD-10-CM | POA: Diagnosis not present

## 2018-01-01 DIAGNOSIS — Z3400 Encounter for supervision of normal first pregnancy, unspecified trimester: Secondary | ICD-10-CM | POA: Diagnosis not present

## 2018-01-13 DIAGNOSIS — Z3491 Encounter for supervision of normal pregnancy, unspecified, first trimester: Secondary | ICD-10-CM | POA: Diagnosis not present

## 2018-01-13 DIAGNOSIS — Z3682 Encounter for antenatal screening for nuchal translucency: Secondary | ICD-10-CM | POA: Diagnosis not present

## 2018-02-01 DIAGNOSIS — Z3482 Encounter for supervision of other normal pregnancy, second trimester: Secondary | ICD-10-CM | POA: Diagnosis not present

## 2018-02-01 DIAGNOSIS — Z3480 Encounter for supervision of other normal pregnancy, unspecified trimester: Secondary | ICD-10-CM | POA: Diagnosis not present

## 2018-02-01 DIAGNOSIS — Z3481 Encounter for supervision of other normal pregnancy, first trimester: Secondary | ICD-10-CM | POA: Diagnosis not present

## 2018-02-26 DIAGNOSIS — Z363 Encounter for antenatal screening for malformations: Secondary | ICD-10-CM | POA: Diagnosis not present

## 2018-02-26 DIAGNOSIS — Z3A18 18 weeks gestation of pregnancy: Secondary | ICD-10-CM | POA: Diagnosis not present

## 2018-02-26 DIAGNOSIS — Z3400 Encounter for supervision of normal first pregnancy, unspecified trimester: Secondary | ICD-10-CM | POA: Diagnosis not present

## 2018-04-02 DIAGNOSIS — Z363 Encounter for antenatal screening for malformations: Secondary | ICD-10-CM | POA: Diagnosis not present

## 2018-04-02 DIAGNOSIS — Z3402 Encounter for supervision of normal first pregnancy, second trimester: Secondary | ICD-10-CM | POA: Diagnosis not present

## 2018-04-02 DIAGNOSIS — R5383 Other fatigue: Secondary | ICD-10-CM | POA: Diagnosis not present

## 2018-04-02 DIAGNOSIS — Z3A23 23 weeks gestation of pregnancy: Secondary | ICD-10-CM | POA: Diagnosis not present

## 2018-04-18 ENCOUNTER — Telehealth: Payer: 59 | Admitting: Family

## 2018-04-18 DIAGNOSIS — J069 Acute upper respiratory infection, unspecified: Secondary | ICD-10-CM

## 2018-04-18 MED ORDER — FLUTICASONE PROPIONATE 50 MCG/ACT NA SUSP: 2.0000 | Freq: Every day | NASAL | 6 refills | Status: DC

## 2018-04-18 NOTE — Progress Notes (Signed)
We are sorry you are not feeling well.  Here is how we plan to help!  Based on what you have shared with me, it looks like you may have a viral upper respiratory infection.  Upper respiratory infections are caused by a large number of viruses; however, rhinovirus is the most common cause.   Symptoms vary from person to person, with common symptoms including sore throat, cough, and fatigue or lack of energy.  A low-grade fever of up to 100.4 may present, but is often uncommon.  Symptoms vary however, and are closely related to a person's age or underlying illnesses.  The most common symptoms associated with an upper respiratory infection are nasal discharge or congestion, cough, sneezing, headache and pressure in the ears and face.  These symptoms usually persist for about 3 to 10 days, but can last up to 2 weeks.  It is important to know that upper respiratory infections do not cause serious illness or complications in most cases.    Approximately 5 minutes was spent documenting and reviewing patient's chart.    Upper respiratory infections can be transmitted from person to person, with the most common method of transmission being a person's hands.  The virus is able to live on the skin and can infect other persons for up to 2 hours after direct contact.  Also, these can be transmitted when someone coughs or sneezes; thus, it is important to cover the mouth to reduce this risk.  To keep the spread of the illness at bay, good hand hygiene is very important.  This is an infection that is most likely caused by a virus. There are no specific treatments other than to help you with the symptoms until the infection runs its course.  We are sorry you are not feeling well.  Here is how we plan to help!   For nasal congestion, you may use an oral decongestants such as Mucinex D or if you have glaucoma or high blood pressure use plain Mucinex.  Saline nasal spray or nasal drops can help and can safely be used as  often as needed for congestion.  For your congestion, I have prescribed Fluticasone nasal spray one spray in each nostril twice a day  If you do not have a history of heart disease, hypertension, diabetes or thyroid disease, prostate/bladder issues or glaucoma, you may also use Sudafed to treat nasal congestion.  It is highly recommended that you consult with a pharmacist or your primary care physician to ensure this medication is safe for you to take.      If you have a sore or scratchy throat, use a saltwater gargle-  to  teaspoon of salt dissolved in a 4-ounce to 8-ounce glass of warm water.  Gargle the solution for approximately 15-30 seconds and then spit.  It is important not to swallow the solution.  You can also use throat lozenges/cough drops and Chloraseptic spray to help with throat pain or discomfort.  Warm or cold liquids can also be helpful in relieving throat pain.  For headache, pain or general discomfort, you can use Ibuprofen or Tylenol as directed.   Some authorities believe that zinc sprays or the use of Echinacea may shorten the course of your symptoms.   HOME CARE . Only take medications as instructed by your medical team. . Be sure to drink plenty of fluids. Water is fine as well as fruit juices, sodas and electrolyte beverages. You may want to stay away from caffeine or   alcohol. If you are nauseated, try taking small sips of liquids. How do you know if you are getting enough fluid? Your urine should be a pale yellow or almost colorless. . Get rest. . Taking a steamy shower or using a humidifier may help nasal congestion and ease sore throat pain. You can place a towel over your head and breathe in the steam from hot water coming from a faucet. . Using a saline nasal spray works much the same way. . Cough drops, hard candies and sore throat lozenges may ease your cough. . Avoid close contacts especially the very young and the elderly . Cover your mouth if you cough or  sneeze . Always remember to wash your hands.   GET HELP RIGHT AWAY IF: . You develop worsening fever. . If your symptoms do not improve within 10 days . You develop yellow or green discharge from your nose over 3 days. . You have coughing fits . You develop a severe head ache or visual changes. . You develop shortness of breath, difficulty breathing or start having chest pain . Your symptoms persist after you have completed your treatment plan  MAKE SURE YOU   Understand these instructions.  Will watch your condition.  Will get help right away if you are not doing well or get worse.  Your e-visit answers were reviewed by a board certified advanced clinical practitioner to complete your personal care plan. Depending upon the condition, your plan could have included both over the counter or prescription medications. Please review your pharmacy choice. If there is a problem, you may call our nursing hot line at and have the prescription routed to another pharmacy. Your safety is important to us. If you have drug allergies check your prescription carefully.   You can use MyChart to ask questions about today's visit, request a non-urgent call back, or ask for a work or school excuse for 24 hours related to this e-Visit. If it has been greater than 24 hours you will need to follow up with your provider, or enter a new e-Visit to address those concerns. You will get an e-mail in the next two days asking about your experience.  I hope that your e-visit has been valuable and will speed your recovery. Thank you for using e-visits.      

## 2018-04-21 ENCOUNTER — Encounter (HOSPITAL_COMMUNITY): Payer: Self-pay

## 2018-04-21 ENCOUNTER — Ambulatory Visit (HOSPITAL_COMMUNITY)
Admission: EM | Admit: 2018-04-21 | Discharge: 2018-04-21 | Disposition: A | Discharge status: Home / Self Care | Payer: 59 | Attending: Family Medicine | Admitting: Family Medicine

## 2018-04-21 DIAGNOSIS — J0100 Acute maxillary sinusitis, unspecified: Secondary | ICD-10-CM

## 2018-04-21 MED ORDER — AMOXICILLIN 500 MG PO CAPS: 500.0000 mg | ORAL_CAPSULE | Freq: Two times a day (BID) | ORAL | 0 refills | Status: AC

## 2018-04-21 NOTE — ED Provider Notes (Signed)
[facility]   [id number] 04/21/18 Arrival Time: 0272   CC: URI symptoms   SUBJECTIVE: History from: patient.  Christina Costa is a 28 y.o. female who is [redacted] weeks pregnant, who presents with nasal congestion, runny nose, sinus pain and pressure that began 1 week ago.   Worsening symptoms over the past day.   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 Had an e-visit this past weekend and diagnosed with viral URI and instructed to take OTC medications.  Has tried OTC medications with temporary relief.  Symptoms are made worse at night.   Denies fever, chills, fatigue, sore throat, SOB, wheezing, chest pain, nausea, changes in bowel or bladder habits.    Received flu shot this year: yes.  ROS: As per HPI.  Past Medical History:  Diagnosis Date  . Headache   . Hypothyroidism   . hypothyroidisn    since age 17  . Seasonal allergies    Past Surgical History:  Procedure Laterality Date  . DILATION AND EVACUATION N/A 09/16/2017   Procedure: DILATATION AND EVACUATION;  Surgeon: Ranae Pila, MD;  Location: [facility];  Service: Gynecology;  Laterality: N/A;  . permanent retainers     upper and lower   . WISDOM TOOTH EXTRACTION     Allergies  Allergen Reactions  . Gluten Meal Nausea And Vomiting   No current facility-administered medications on file prior to encounter.    Current Outpatient Medications on File Prior to Encounter  Medication Sig Dispense Refill  . acetaminophen (TYLENOL) 500 MG tablet Take 500 mg by mouth every 6 (six) hours as needed.    . Cholecalciferol (VITAMIN D3) 2000 units TABS Take 2,000 Units by mouth daily.    . fluticasone (FLONASE) 50 MCG/ACT nasal spray Place 2 sprays into both nostrils daily. 16 g 6  . HYDROmorphone (DILAUDID) 2 MG tablet Take 1 tablet (2 mg total) by mouth every 4 (four) hours as needed for severe pain. 12 tablet 0  . ibuprofen (ADVIL,MOTRIN) 200 MG tablet Take 400 mg by mouth every 6 (six) hours as needed.    Marland Kitchen levothyroxine  (SYNTHROID, LEVOTHROID) 100 MCG tablet Take 1 tablet (100 mcg total) by mouth daily. (Patient taking differently: Take 75 mcg by mouth daily before breakfast. ) 90 tablet 3  . liothyronine (CYTOMEL) 5 MCG tablet Take 5 mcg by mouth daily.  0  . Omega 3-6-9 Fatty Acids (TRIPLE OMEGA COMPLEX PO) Take 4 capsules by mouth daily.    Marland Kitchen POTASSIUM IODIDE PO Take 1 tablet by mouth daily.    . vitamin B-12 (CYANOCOBALAMIN) 1000 MCG tablet Take 1,000 mcg by mouth daily.    . vitamin C (ASCORBIC ACID) 500 MG tablet Take 500 mg by mouth daily.     Social History   Socioeconomic History  . Marital status: Married    Spouse name: Not on file  . Number of children: Not on file  . Years of education: Not on file  . Highest education level: Not on file  Occupational History  . Not on file  Social Needs  . Financial resource strain: Not on file  . Food insecurity:    Worry: Not on file    Inability: Not on file  . Transportation needs:    Medical: Not on file    Non-medical: Not on file  Tobacco Use  . Smoking status: Never Smoker  . Smokeless tobacco: Never Used  Substance and Sexual Activity  . Alcohol use: No  . Drug use: No  .  Sexual activity: Yes    Birth control/protection: None    Comment: approx [redacted] wks gestation  Lifestyle  . Physical activity:    Days per week: Not on file    Minutes per session: Not on file  . Stress: Not on file  Relationships  . Social connections:    Talks on phone: Not on file    Gets together: Not on file    Attends religious service: Not on file    Active member of club or organization: Not on file    Attends meetings of clubs or organizations: Not on file    Relationship status: Not on file  . Intimate partner violence:    Fear of current or ex partner: Not on file    Emotionally abused: Not on file    Physically abused: Not on file    Forced sexual activity: Not on file  Other Topics Concern  . Not on file  Social History Narrative  . Not on file    Family History  Problem Relation Age of Onset  . Allergy (severe) Mother   . Hypothyroidism Mother   . Arthritis Maternal Grandmother   . Heart disease Maternal Grandfather     OBJECTIVE:  Vitals:   04/21/18 0817  BP: (!) 124/92  Pulse: 96  Resp: 20  Temp: (!) 97.5 F (36.4 C)  TempSrc: Oral  SpO2: 100%     General appearance: alert; appears mildly fatigued, but nontoxic; speaking in full sentences and tolerating own secretions HEENT: NCAT; Ears: EACs clear, TMs pearly gray; Eyes: PERRL.  EOM grossly intact. Sinuses: TTP over maxillary sinuses; Nose: nares patent without rhinorrhea, Throat: oropharynx clear, tonsils non erythematous or enlarged, uvula midline  Neck: supple without LAD Lungs: unlabored respirations, symmetrical air entry; cough: absent; no respiratory distress; CTAB Heart: regular rate and rhythm.  Skin: warm and dry Psychological: alert and cooperative; normal mood and affect  ASSESSMENT & PLAN:  1. Acute non-recurrent maxillary sinusitis     Meds ordered this encounter  Medications  . amoxicillin (AMOXIL) 500 MG capsule    Sig: Take 1 capsule (500 mg total) by mouth 2 (two) times daily for 10 days.    Dispense:  20 capsule    Refill:  0    Order Specific Question:   Supervising Provider    Answer:   Eustace Moore [[id number]]   Get plenty of rest and push fluids Amoxicillin prescribed.  Take as directed and to completion Use OTC medications like tylenol as needed fever or pain Follow up with PCP if symptoms persist Return or go to ER if you have any new or worsening symptoms fever, chills, nausea, vomiting, chest pain, cough, shortness of breath, wheezing, abdominal pain, changes in bowel or bladder habits, etc...   Reviewed expectations re: course of current medical issues. Questions answered. Outlined signs and symptoms indicating need for more acute intervention. Patient verbalized understanding. After Visit Summary given.          Rennis Harding, PA-C 04/21/18 [phone]

## 2018-04-21 NOTE — ED Triage Notes (Signed)
Pt presents with ongoing sinus infection; Pt states she had an E-visit Sunday and was told to use OTC meds & nasal spray but has had no relief.

## 2018-04-21 NOTE — Discharge Instructions (Addendum)
Get plenty of rest and push fluids Amoxicillin prescribed.  Take as directed and to completion Use OTC medications like tylenol as needed fever or pain Follow up with PCP if symptoms persist Return or go to ER if you have any new or worsening symptoms fever, chills, nausea, vomiting, chest pain, cough, shortness of breath, wheezing, abdominal pain, changes in bowel or bladder habits, etc..Marland Kitchen

## 2018-04-23 DIAGNOSIS — Z3480 Encounter for supervision of other normal pregnancy, unspecified trimester: Secondary | ICD-10-CM | POA: Diagnosis not present

## 2018-04-23 DIAGNOSIS — Z23 Encounter for immunization: Secondary | ICD-10-CM | POA: Diagnosis not present

## 2018-04-23 DIAGNOSIS — Z362 Encounter for other antenatal screening follow-up: Secondary | ICD-10-CM | POA: Diagnosis not present

## 2018-04-23 DIAGNOSIS — Z3A26 26 weeks gestation of pregnancy: Secondary | ICD-10-CM | POA: Diagnosis not present

## 2018-04-23 DIAGNOSIS — Z3400 Encounter for supervision of normal first pregnancy, unspecified trimester: Secondary | ICD-10-CM | POA: Diagnosis not present

## 2018-05-18 DIAGNOSIS — Z3400 Encounter for supervision of normal first pregnancy, unspecified trimester: Secondary | ICD-10-CM | POA: Diagnosis not present

## 2018-05-18 DIAGNOSIS — O26893 Other specified pregnancy related conditions, third trimester: Secondary | ICD-10-CM | POA: Diagnosis not present

## 2018-05-18 DIAGNOSIS — Z3A30 30 weeks gestation of pregnancy: Secondary | ICD-10-CM | POA: Diagnosis not present

## 2018-06-18 DIAGNOSIS — O26893 Other specified pregnancy related conditions, third trimester: Secondary | ICD-10-CM | POA: Diagnosis not present

## 2018-06-18 DIAGNOSIS — Z3A34 34 weeks gestation of pregnancy: Secondary | ICD-10-CM | POA: Diagnosis not present

## 2018-06-18 DIAGNOSIS — Z3403 Encounter for supervision of normal first pregnancy, third trimester: Secondary | ICD-10-CM | POA: Diagnosis not present

## 2018-06-24 DIAGNOSIS — Z369 Encounter for antenatal screening, unspecified: Secondary | ICD-10-CM | POA: Diagnosis not present

## 2018-06-24 DIAGNOSIS — D696 Thrombocytopenia, unspecified: Secondary | ICD-10-CM | POA: Diagnosis not present

## 2018-06-30 NOTE — H&P (Signed)
Christina Costa is a 28 y.o. female presenting for primary CS s/s breech presentation. Pregnancy c/b known septate uterus. Hx MAB x1 requiring D&C. Known low platelets - repeat pending. Last 134k. Hx hypothyroidism with recent good control.    OB History    Gravida  1   Para      Term      Preterm      AB      Living        SAB      TAB      Ectopic      Multiple      Live Births             Past Medical History:  Diagnosis Date  . Headache   . Hypothyroidism   . hypothyroidisn    since age 62  . Seasonal allergies    Past Surgical History:  Procedure Laterality Date  . DILATION AND EVACUATION N/A 09/16/2017   Procedure: DILATATION AND EVACUATION;  Surgeon: Ranae Pila, MD;  Location: [facility];  Service: Gynecology;  Laterality: N/A;  . permanent retainers     upper and lower   . WISDOM TOOTH EXTRACTION     Family History: family history includes Allergy (severe) in her mother; Arthritis in her maternal grandmother; Heart disease in her maternal grandfather; Hypothyroidism in her mother. Social History:  reports that she has never smoked. She has never used smokeless tobacco. She reports that she does not drink alcohol or use drugs.     Maternal Diabetes: No Genetic Screening: Normal Maternal Ultrasounds/Referrals: Normal Fetal Ultrasounds or other Referrals:  None Maternal Substance Abuse:  No Significant Maternal Medications:  None Significant Maternal Lab Results:  None Other Comments:  None  ROS History   Last menstrual period 07/17/2017. Exam Physical Exam  (from office) NAD, A&O NWOB Abd soft, nondistended, gravid Breech on [place] Prenatal labs: ABO, Rh: --/--/B POS, B POS Performed at [facility], [address] [place], [place] [address]  [phone]) Antibody: NEG (08/07 0742) Rubella:   RPR:    HBsAg:    HIV:    GBS:   unknown  Assessment/Plan:  28 yo  presenting for scheduled primary CS s/s breech presentation with  known septate uterus. Risks discussed including infection, bleeding, damage to surrounding structures, the need for additional procedures including hysterectomy, and the possibility of uterine rupture with neonatal morbidity/mortality, scarring, and abnormal placentation with subsequent pregnancies. Patient agrees to proceed. 2g ancef on call to OR.     Ranae Pila 06/30/2018, 3:58 PM

## 2018-07-01 DIAGNOSIS — D696 Thrombocytopenia, unspecified: Secondary | ICD-10-CM | POA: Diagnosis not present

## 2018-07-07 ENCOUNTER — Encounter (HOSPITAL_COMMUNITY): Payer: Self-pay | Admitting: *Deleted

## 2018-07-07 DIAGNOSIS — Z3480 Encounter for supervision of other normal pregnancy, unspecified trimester: Secondary | ICD-10-CM | POA: Diagnosis not present

## 2018-07-07 NOTE — Patient Instructions (Signed)
Christina Costa  07/07/2018   Your procedure is scheduled on:  07/20/2018  Arrive at 0500 at [employer] [facility] on [address] at [facility]  and [employer]. You are invited to use the FREE valet parking or use the Visitor's parking deck.  Pick up the phone at the desk and dial [phone].  Call this number if you have problems the morning of surgery: [phone]  Remember:   Do not eat food:(After Midnight) Desps de medianoche.  Do not drink clear liquids: (After Midnight) Desps de medianoche.  Take these medicines the morning of surgery with A SIP OF WATER:  levothyroxine   Do not wear jewelry, make-up or nail polish.  Do not wear lotions, powders, or perfumes. Do not wear deodorant.  Do not shave 48 hours prior to surgery.  Do not bring valuables to the hospital.  [facility] is not   responsible for any belongings or valuables brought to the hospital.  Contacts, dentures or bridgework may not be worn into surgery.  Leave suitcase in the car. After surgery it may be brought to your room.  For patients admitted to the hospital, checkout time is 11:00 AM the day of              discharge.      Please read over the following fact sheets that you were given:     Preparing for Surgery

## 2018-07-09 ENCOUNTER — Inpatient Hospital Stay (HOSPITAL_COMMUNITY)
Admission: AD | Admit: 2018-07-09 | Discharge: 2018-07-12 | DRG: 788 | Disposition: A | Discharge status: Home / Self Care | Payer: 59 | Attending: Obstetrics and Gynecology | Admitting: Obstetrics and Gynecology

## 2018-07-09 ENCOUNTER — Inpatient Hospital Stay (HOSPITAL_COMMUNITY): Payer: 59 | Admitting: Anesthesiology

## 2018-07-09 ENCOUNTER — Encounter (HOSPITAL_COMMUNITY)
Admission: AD | Disposition: A | Discharge status: Home / Self Care | Payer: Self-pay | Source: Home / Self Care | Attending: Obstetrics and Gynecology

## 2018-07-09 ENCOUNTER — Encounter (HOSPITAL_COMMUNITY): Payer: Self-pay | Admitting: *Deleted

## 2018-07-09 ENCOUNTER — Other Ambulatory Visit: Payer: Self-pay

## 2018-07-09 DIAGNOSIS — Z1159 Encounter for screening for other viral diseases: Secondary | ICD-10-CM | POA: Diagnosis not present

## 2018-07-09 DIAGNOSIS — O99284 Endocrine, nutritional and metabolic diseases complicating childbirth: Secondary | ICD-10-CM | POA: Diagnosis present

## 2018-07-09 DIAGNOSIS — O2293 Venous complication in pregnancy, unspecified, third trimester: Secondary | ICD-10-CM | POA: Diagnosis not present

## 2018-07-09 DIAGNOSIS — Z3490 Encounter for supervision of normal pregnancy, unspecified, unspecified trimester: Secondary | ICD-10-CM

## 2018-07-09 DIAGNOSIS — O99214 Obesity complicating childbirth: Secondary | ICD-10-CM | POA: Diagnosis present

## 2018-07-09 DIAGNOSIS — O3403 Maternal care for unspecified congenital malformation of uterus, third trimester: Secondary | ICD-10-CM | POA: Diagnosis present

## 2018-07-09 DIAGNOSIS — O321XX0 Maternal care for breech presentation, not applicable or unspecified: Secondary | ICD-10-CM | POA: Diagnosis not present

## 2018-07-09 DIAGNOSIS — Z3A37 37 weeks gestation of pregnancy: Secondary | ICD-10-CM | POA: Diagnosis not present

## 2018-07-09 DIAGNOSIS — Q512 Other doubling of uterus, unspecified: Secondary | ICD-10-CM | POA: Diagnosis not present

## 2018-07-09 DIAGNOSIS — O134 Gestational [pregnancy-induced] hypertension without significant proteinuria, complicating childbirth: Secondary | ICD-10-CM | POA: Diagnosis present

## 2018-07-09 DIAGNOSIS — E039 Hypothyroidism, unspecified: Secondary | ICD-10-CM | POA: Diagnosis present

## 2018-07-09 LAB — SARS CORONAVIRUS 2 BY RT PCR (HOSPITAL ORDER, PERFORMED IN ~~LOC~~ HOSPITAL LAB): SARS Coronavirus 2: NEGATIVE

## 2018-07-09 LAB — TYPE AND SCREEN
ABO/RH(D): B POS
Antibody Screen: NEGATIVE

## 2018-07-09 LAB — ABO/RH: ABO/RH(D): B POS

## 2018-07-09 LAB — CBC
HCT: 40.2 % (ref 36.0–46.0)
Hemoglobin: 13.1 g/dL (ref 12.0–15.0)
MCH: 27.5 pg (ref 26.0–34.0)
MCHC: 32.6 g/dL (ref 30.0–36.0)
MCV: 84.5 fL (ref 80.0–100.0)
Platelets: 140 10*3/uL — ABNORMAL LOW (ref 150–400)
RBC: 4.76 MIL/uL (ref 3.87–5.11)
RDW: 13.7 % (ref 11.5–15.5)
WBC: 9.5 10*3/uL (ref 4.0–10.5)
nRBC: 0 % (ref 0.0–0.2)

## 2018-07-09 SURGERY — Surgical Case
Anesthesia: Spinal

## 2018-07-09 MED ORDER — HYDROMORPHONE HCL 1 MG/ML IJ SOLN: 0.2000 mg | INTRAMUSCULAR | Status: DC | PRN

## 2018-07-09 MED ORDER — SIMETHICONE 80 MG PO CHEW
80.0000 mg | CHEWABLE_TABLET | ORAL | Status: DC
  Administered 2018-07-09 – 2018-07-12 (×3): 80 mg via ORAL
  Filled 2018-07-09 (×4): qty 1

## 2018-07-09 MED ORDER — WITCH HAZEL-GLYCERIN EX PADS: 1.0000 "application " | MEDICATED_PAD | CUTANEOUS | Status: DC | PRN

## 2018-07-09 MED ORDER — LEVOTHYROXINE SODIUM 75 MCG PO TABS
75.0000 ug | ORAL_TABLET | Freq: Every day | ORAL | Status: DC
  Administered 2018-07-10 – 2018-07-12 (×3): 75 ug via ORAL
  Filled 2018-07-09 (×3): qty 1

## 2018-07-09 MED ORDER — PHENYLEPHRINE HCL-NACL 20-0.9 MG/250ML-% IV SOLN
INTRAVENOUS | Status: AC
  Filled 2018-07-09: qty 250

## 2018-07-09 MED ORDER — COCONUT OIL OIL
1.0000 "application " | TOPICAL_OIL | Status: DC | PRN
  Administered 2018-07-11: 1 via TOPICAL

## 2018-07-09 MED ORDER — SENNOSIDES-DOCUSATE SODIUM 8.6-50 MG PO TABS
2.0000 | ORAL_TABLET | ORAL | Status: DC
  Administered 2018-07-09 – 2018-07-11 (×3): 2 via ORAL
  Filled 2018-07-09 (×3): qty 2

## 2018-07-09 MED ORDER — NALOXONE HCL 0.4 MG/ML IJ SOLN: 0.4000 mg | INTRAMUSCULAR | Status: DC | PRN

## 2018-07-09 MED ORDER — MORPHINE SULFATE (PF) 0.5 MG/ML IJ SOLN
INTRAMUSCULAR | Status: DC | PRN
  Administered 2018-07-09: .15 mg via EPIDURAL

## 2018-07-09 MED ORDER — BUPIVACAINE IN DEXTROSE 0.75-8.25 % IT SOLN
INTRATHECAL | Status: DC | PRN
  Administered 2018-07-09: 1.6 mL via INTRATHECAL

## 2018-07-09 MED ORDER — SODIUM CHLORIDE 0.9 % IV SOLN
INTRAVENOUS | Status: DC | PRN
  Administered 2018-07-09: 40 [IU] via INTRAVENOUS

## 2018-07-09 MED ORDER — DEXAMETHASONE SODIUM PHOSPHATE 4 MG/ML IJ SOLN
INTRAMUSCULAR | Status: AC
  Filled 2018-07-09: qty 1

## 2018-07-09 MED ORDER — CEFAZOLIN SODIUM-DEXTROSE 2-4 GM/100ML-% IV SOLN
2.0000 g | INTRAVENOUS | Status: AC
  Administered 2018-07-09: 2 g via INTRAVENOUS

## 2018-07-09 MED ORDER — ONDANSETRON HCL 4 MG/2ML IJ SOLN
INTRAMUSCULAR | Status: DC | PRN
  Administered 2018-07-09: 4 mg via INTRAVENOUS

## 2018-07-09 MED ORDER — OXYCODONE HCL 5 MG PO TABS: 5.0000 mg | ORAL_TABLET | ORAL | Status: DC | PRN

## 2018-07-09 MED ORDER — SCOPOLAMINE 1 MG/3DAYS TD PT72
MEDICATED_PATCH | TRANSDERMAL | Status: AC
  Filled 2018-07-09: qty 1

## 2018-07-09 MED ORDER — PHENYLEPHRINE HCL-NACL 20-0.9 MG/250ML-% IV SOLN
INTRAVENOUS | Status: DC | PRN
  Administered 2018-07-09: 60 ug/min via INTRAVENOUS

## 2018-07-09 MED ORDER — SOD CITRATE-CITRIC ACID 500-334 MG/5ML PO SOLN
ORAL | Status: AC
  Filled 2018-07-09: qty 15

## 2018-07-09 MED ORDER — FENTANYL CITRATE (PF) 100 MCG/2ML IJ SOLN
INTRAMUSCULAR | Status: DC | PRN
  Administered 2018-07-09: 15 ug via INTRATHECAL

## 2018-07-09 MED ORDER — ZOLPIDEM TARTRATE 5 MG PO TABS: 5.0000 mg | ORAL_TABLET | Freq: Every evening | ORAL | Status: DC | PRN

## 2018-07-09 MED ORDER — ACETAMINOPHEN 500 MG PO TABS
1000.0000 mg | ORAL_TABLET | Freq: Four times a day (QID) | ORAL | Status: DC
  Administered 2018-07-09 – 2018-07-12 (×10): 1000 mg via ORAL
  Filled 2018-07-09 (×10): qty 2

## 2018-07-09 MED ORDER — NALBUPHINE HCL 10 MG/ML IJ SOLN: 5.0000 mg | Freq: Once | INTRAMUSCULAR | Status: DC | PRN

## 2018-07-09 MED ORDER — DEXAMETHASONE SODIUM PHOSPHATE 4 MG/ML IJ SOLN
INTRAMUSCULAR | Status: DC | PRN
  Administered 2018-07-09: 4 mg via INTRAVENOUS

## 2018-07-09 MED ORDER — OXYTOCIN 40 UNITS IN NORMAL SALINE INFUSION - SIMPLE MED: 2.5000 [IU]/h | INTRAVENOUS | Status: AC

## 2018-07-09 MED ORDER — NALBUPHINE HCL 10 MG/ML IJ SOLN: 5.0000 mg | INTRAMUSCULAR | Status: DC | PRN

## 2018-07-09 MED ORDER — ONDANSETRON HCL 4 MG/2ML IJ SOLN: 4.0000 mg | Freq: Three times a day (TID) | INTRAMUSCULAR | Status: DC | PRN

## 2018-07-09 MED ORDER — SODIUM CHLORIDE 0.9 % IV SOLN
INTRAVENOUS | Status: DC | PRN
  Administered 2018-07-09: 18:00:00 via INTRAVENOUS

## 2018-07-09 MED ORDER — DIBUCAINE (PERIANAL) 1 % EX OINT: 1.0000 "application " | TOPICAL_OINTMENT | CUTANEOUS | Status: DC | PRN

## 2018-07-09 MED ORDER — SIMETHICONE 80 MG PO CHEW: 80.0000 mg | CHEWABLE_TABLET | ORAL | Status: DC | PRN

## 2018-07-09 MED ORDER — DIPHENHYDRAMINE HCL 50 MG/ML IJ SOLN: 12.5000 mg | INTRAMUSCULAR | Status: DC | PRN

## 2018-07-09 MED ORDER — DIPHENHYDRAMINE HCL 25 MG PO CAPS: 25.0000 mg | ORAL_CAPSULE | Freq: Four times a day (QID) | ORAL | Status: DC | PRN

## 2018-07-09 MED ORDER — NALOXONE HCL 4 MG/10ML IJ SOLN
1.0000 ug/kg/h | INTRAVENOUS | Status: DC | PRN
  Filled 2018-07-09: qty 5

## 2018-07-09 MED ORDER — LACTATED RINGERS IV SOLN
INTRAVENOUS | Status: DC | PRN
  Administered 2018-07-09: 17:00:00 via INTRAVENOUS

## 2018-07-09 MED ORDER — SOD CITRATE-CITRIC ACID 500-334 MG/5ML PO SOLN
30.0000 mL | ORAL | Status: AC
  Administered 2018-07-09: 17:00:00 30 mL via ORAL

## 2018-07-09 MED ORDER — SCOPOLAMINE 1 MG/3DAYS TD PT72
1.0000 | MEDICATED_PATCH | Freq: Once | TRANSDERMAL | Status: DC
  Administered 2018-07-09: 19:00:00 1.5 mg via TRANSDERMAL

## 2018-07-09 MED ORDER — MENTHOL 3 MG MT LOZG: 1.0000 | LOZENGE | OROMUCOSAL | Status: DC | PRN

## 2018-07-09 MED ORDER — SODIUM CHLORIDE 0.9% FLUSH: 3.0000 mL | INTRAVENOUS | Status: DC | PRN

## 2018-07-09 MED ORDER — MORPHINE SULFATE (PF) 0.5 MG/ML IJ SOLN
INTRAMUSCULAR | Status: AC
  Filled 2018-07-09: qty 10

## 2018-07-09 MED ORDER — DIPHENHYDRAMINE HCL 25 MG PO CAPS: 25.0000 mg | ORAL_CAPSULE | ORAL | Status: DC | PRN

## 2018-07-09 MED ORDER — LACTATED RINGERS IV SOLN
INTRAVENOUS | Status: DC
  Administered 2018-07-10: 06:00:00 via INTRAVENOUS

## 2018-07-09 MED ORDER — CEFAZOLIN SODIUM-DEXTROSE 2-4 GM/100ML-% IV SOLN
INTRAVENOUS | Status: AC
  Filled 2018-07-09: qty 100

## 2018-07-09 MED ORDER — FENTANYL CITRATE (PF) 100 MCG/2ML IJ SOLN
INTRAMUSCULAR | Status: AC
  Filled 2018-07-09: qty 2

## 2018-07-09 MED ORDER — ONDANSETRON HCL 4 MG/2ML IJ SOLN
INTRAMUSCULAR | Status: AC
  Filled 2018-07-09: qty 2

## 2018-07-09 MED ORDER — TETANUS-DIPHTH-ACELL PERTUSSIS 5-2.5-18.5 LF-MCG/0.5 IM SUSP: 0.5000 mL | Freq: Once | INTRAMUSCULAR | Status: DC

## 2018-07-09 MED ORDER — ACETAMINOPHEN 10 MG/ML IV SOLN: 1000.0000 mg | Freq: Once | INTRAVENOUS | Status: DC | PRN

## 2018-07-09 MED ORDER — SODIUM CHLORIDE 0.9 % IR SOLN
Status: DC | PRN
  Administered 2018-07-09: 1

## 2018-07-09 MED ORDER — PRENATAL MULTIVITAMIN CH
1.0000 | ORAL_TABLET | Freq: Every day | ORAL | Status: DC
  Administered 2018-07-10 – 2018-07-11 (×2): 1 via ORAL
  Filled 2018-07-09 (×2): qty 1

## 2018-07-09 MED ORDER — SIMETHICONE 80 MG PO CHEW
80.0000 mg | CHEWABLE_TABLET | Freq: Three times a day (TID) | ORAL | Status: DC
  Administered 2018-07-10 – 2018-07-12 (×7): 80 mg via ORAL
  Filled 2018-07-09 (×7): qty 1

## 2018-07-09 SURGICAL SUPPLY — 35 items
BENZOIN TINCTURE PRP APPL 2/3 (GAUZE/BANDAGES/DRESSINGS) ×2 IMPLANT
CHLORAPREP W/TINT 26ML (MISCELLANEOUS) ×2 IMPLANT
CLAMP CORD UMBIL (MISCELLANEOUS) IMPLANT
CLOTH BEACON ORANGE TIMEOUT ST (SAFETY) ×2 IMPLANT
DERMABOND ADVANCED (GAUZE/BANDAGES/DRESSINGS) ×1
DERMABOND ADVANCED .7 DNX12 (GAUZE/BANDAGES/DRESSINGS) ×1 IMPLANT
DRSG OPSITE POSTOP 4X10 (GAUZE/BANDAGES/DRESSINGS) ×2 IMPLANT
ELECT REM PT RETURN 9FT ADLT (ELECTROSURGICAL) ×2
ELECTRODE REM PT RTRN 9FT ADLT (ELECTROSURGICAL) ×1 IMPLANT
EXTRACTOR VACUUM KIWI (MISCELLANEOUS) IMPLANT
GLOVE BIO SURGEON STRL SZ 6.5 (GLOVE) ×2 IMPLANT
GLOVE BIOGEL PI IND STRL 6.5 (GLOVE) ×1 IMPLANT
GLOVE BIOGEL PI IND STRL 7.0 (GLOVE) ×2 IMPLANT
GLOVE BIOGEL PI INDICATOR 6.5 (GLOVE) ×1
GLOVE BIOGEL PI INDICATOR 7.0 (GLOVE) ×2
GOWN STRL REUS W/TWL LRG LVL3 (GOWN DISPOSABLE) ×4 IMPLANT
KIT ABG SYR 3ML LUER SLIP (SYRINGE) ×2 IMPLANT
NEEDLE HYPO 25X5/8 SAFETYGLIDE (NEEDLE) ×2 IMPLANT
NS IRRIG 1000ML POUR BTL (IV SOLUTION) ×2 IMPLANT
PACK C SECTION WH (CUSTOM PROCEDURE TRAY) ×2 IMPLANT
PAD OB MATERNITY 4.3X12.25 (PERSONAL CARE ITEMS) ×2 IMPLANT
PENCIL SMOKE EVAC W/HOLSTER (ELECTROSURGICAL) ×2 IMPLANT
RETRACTOR WND ALEXIS 25 LRG (MISCELLANEOUS) IMPLANT
RTRCTR WOUND ALEXIS 25CM LRG (MISCELLANEOUS)
STRIP CLOSURE SKIN 1/2X4 (GAUZE/BANDAGES/DRESSINGS) ×2 IMPLANT
SUT PLAIN 0 NONE (SUTURE) IMPLANT
SUT PLAIN 2 0 (SUTURE) ×1
SUT PLAIN ABS 2-0 CT1 27XMFL (SUTURE) ×1 IMPLANT
SUT VIC AB 0 CT1 36 (SUTURE) ×2 IMPLANT
SUT VIC AB 0 CTX 36 (SUTURE) ×2
SUT VIC AB 0 CTX36XBRD ANBCTRL (SUTURE) ×2 IMPLANT
SUT VIC AB 4-0 PS2 27 (SUTURE) ×2 IMPLANT
TOWEL OR 17X24 6PK STRL BLUE (TOWEL DISPOSABLE) ×2 IMPLANT
TRAY FOLEY W/BAG SLVR 14FR LF (SET/KITS/TRAYS/PACK) IMPLANT
WATER STERILE IRR 1000ML POUR (IV SOLUTION) ×2 IMPLANT

## 2018-07-09 NOTE — Consult Note (Signed)
Neonatology Note:   Attendance at C-section:    I was asked by Dr. Elon Spanner to attend this primary C/S at 37 3/7 weeks due to breech presentation. The mother is a G2P0A1 B pos, GBS unknown with septate uterus, hypothyroidism. ROM at delivery, fluid clear. Infant vigorous with good spontaneous cry and tone. Delayed cord clamping was done. Needed only minimal bulb suctioning. Ap 9/9. Lungs clear to ausc in DR. Infant is able to remain with her mother for skin to skin time under nursing supervision. Transferred to the care of Pediatrician.   Doretha Sou, MD

## 2018-07-09 NOTE — Op Note (Signed)
PROCEDURE DATE: 07/09/18  PREOPERATIVE DIAGNOSIS: Intrauterine pregnancy at 37.3 wga, Indication: breech, PIH  POSTOPERATIVE DIAGNOSIS:The same  PROCEDURE: Primary Low TransverseCesarean Section  SURGEON: Dr. Belva Agee  INDICATIONS:This is a 28yo G2P0 at 71.3 wga requiring cesarean section secondary to breech presentation with known uterine septum in the setting of PIH without severe features. PLTs low but not <100k. Labs in office otherwise normal. Decision made to proceed with pLTCS.The risks of cesarean section discussed with the patient included but were not limited to: bleeding which may require transfusion or reoperation; infection which may require antibiotics; injury to bowel, bladder, ureters or other surrounding organs; injury to the fetus; need for additional procedures including hysterectomy in the event of a life-threatening hemorrhage; placental abnormalities wth subsequent pregnancies, incisional problems, thromboembolic phenomenon and other postoperative/anesthesia complications. The patient agreed with the proposed plan, giving informed consent for the procedure.   FINDINGS: Viable femaleinfant in breeck presentation,APGARspending, Weight pending, Amniotic fluid clear, Intact placenta, three vessel cord. Uterus with septum. Grossly normal ovaries and fallopian tubes. .  ANESTHESIA: Epidural ESTIMATED BLOOD LOSS: 222cc SPECIMENS: Placenta for pathology COMPLICATIONS: None immediate   PROCEDURE IN DETAIL: The patient received intravenous antibiotics (2g Ancef) and had sequential compression devices applied to her lower extremities while in the preoperative area. Shewasthen taken to the operating roomwhere epidural anesthesiawas dosed up to surgical level andwas found to be adequate. She was then placed in a dorsal supine position with a leftward tilt,and prepped and draped in a sterile manner.A foley catheter was placed into her bladder and  attached to constant gravity. After an adequate timeout was performed, aPfannenstiel skin incision was made with scalpel and carried through to the underlying layer of fascia. The fascia was incised in the midline and this incision was extended bilaterally using the Mayo scissors. Kocher clamps were applied to the superior aspect of the fascial incision and the underlying rectus muscles were dissected off bluntly. A similar process was carried out on the inferior aspect of the facial incision. The rectus muscles were separated in the midline bluntly and the peritoneum was entered bluntly. A bladder flap was created sharply and developed bluntly.Atransverse hysterotomy was made with a scalpel and extended bilaterally bluntly. The bladder blade was then removed. The infant was successfully delivered, and cord was clamped and cut and infant was handed over to awaiting neonatology team. Uterine massage was then administered and the placenta delivered intact with three-vessel cord. Cord gases were taken. The uterus was cleared of clot and debris. The hysterotomy was closed with 0 vicryl.A second imbricating suture of 0-vicryl was used to reinforce the incision and aid in hemostasis.The fascia was closed with 0-Vicryl in a running fashion with good restoration of anatomy. The subcutaneus tissue was irrigated and was reapproximated using three interrupted plain gut stitches. The skin was closed with 4-0 Vicryl in a subcuticular fashion.  Final EBL was 222cc (all surgical site and was hemostatic at end of procedure) without any further bleeding on exam.    It's a girl - "Selah" (See-lah)!!   Pt tolerated the procedure well. All sponge/lap/needle counts were correct X 2. Pt taken to recovery room in stable condition.   Belva Agee MD

## 2018-07-09 NOTE — H&P (Signed)
Christina Costa is a 28 y.o. female presenting for primary CS s/s breech presentation. Pregnancy c/b known septate uterus. Hx MAB x1 requiring D&C. Known low platelets - repeat pending. Last 134k. Hx hypothyroidism with recent good control.    OB History    Gravida  2   Para      Term      Preterm      AB  1   Living        SAB  1   TAB      Ectopic      Multiple      Live Births             Past Medical History:  Diagnosis Date  . Headache   . Hypothyroidism   . hypothyroidisn    since age 50  . Seasonal allergies    Past Surgical History:  Procedure Laterality Date  . DILATION AND EVACUATION N/A 09/16/2017   Procedure: DILATATION AND EVACUATION;  Surgeon: Ranae Pila, MD;  Location: [facility];  Service: Gynecology;  Laterality: N/A;  . permanent retainers     upper and lower   . WISDOM TOOTH EXTRACTION     Family History: family history includes Allergy (severe) in her mother; Arthritis in her maternal grandmother; Heart disease in her maternal grandfather; Hypothyroidism in her mother. Social History:  reports that she has never smoked. She has never used smokeless tobacco. She reports that she does not drink alcohol or use drugs.     Maternal Diabetes: No Genetic Screening: Normal Maternal Ultrasounds/Referrals: Normal Fetal Ultrasounds or other Referrals:  None Maternal Substance Abuse:  No Significant Maternal Medications:  None Significant Maternal Lab Results:  None Other Comments:  None  ROS History   Blood pressure (!) 141/88, pulse 92, temperature 98.5 F (36.9 C), temperature source Oral, resp. rate 18, height 5\' 6"  (1.676 m), weight 111.1 kg, last menstrual period 07/17/2017, SpO2 97 %. Exam Physical Exam  (from office) NAD, A&O NWOB Abd soft, nondistended, gravid Breech on [place] Prenatal labs: ABO, Rh: --/--/B POS, B POS Performed at [facility], [address] [place], [place] [address]  [phone]/29 1300) Antibody: NEG  (05/29 1300) Rubella: Immune (11/15 0000) RPR: Nonreactive (11/15 0000)  HBsAg: Negative (11/15 0000)  HIV: Non-reactive (11/15 0000)  GBS:   unknown  Assessment/Plan:  28 yo  presenting for scheduled primary CS s/s breech presentation with known septate uterus. Risks discussed including infection, bleeding, damage to surrounding structures, the need for additional procedures including hysterectomy, and the possibility of uterine rupture with neonatal morbidity/mortality, scarring, and abnormal placentation with subsequent pregnancies. Patient agrees to proceed. 2g ancef on call to OR.     Ranae Pila 07/09/2018, 5:00 PM   Patient presented with elevated BPs. Given High Bp and past 37 weeks, delivery warranted for hypertension of pregnancy without severe features.  Platelets low but not <100k.  Patient still breech on [place] today. Has known uterine septum. Again consented and no other updates to h and P. 2g ancef on call to or.  Consent signed again

## 2018-07-09 NOTE — Transfer of Care (Signed)
Immediate Anesthesia Transfer of Care Note  Patient: Christina Costa  Procedure(s) Performed: CESAREAN SECTION (N/A )  Patient Location: PACU  Anesthesia Type:Spinal  Level of Consciousness: awake and alert   Airway & Oxygen Therapy: Patient Spontanous Breathing  Post-op Assessment: Report given to RN and Post -op Vital signs reviewed and stable  Post vital signs: Reviewed  Last Vitals:  Vitals Value Taken Time  BP 131/76 07/09/2018  6:18 PM  Temp    Pulse 70 07/09/2018  6:21 PM  Resp 18 07/09/2018  6:21 PM  SpO2 100 % 07/09/2018  6:21 PM  Vitals shown include unvalidated device data.  Last Pain:  Vitals:   07/09/18 1557  TempSrc:   PainSc: 0-No pain         Complications: No apparent anesthesia complications

## 2018-07-09 NOTE — Anesthesia Preprocedure Evaluation (Signed)
Anesthesia Evaluation  Patient identified by MRN, date of birth, ID band Patient awake    Reviewed: Allergy & Precautions, NPO status , Patient's Chart, lab work & pertinent test results  Airway Mallampati: III  TM Distance: >3 FB Neck ROM: Full    Dental no notable dental hx. (+) Teeth Intact, Dental Advisory Given   Pulmonary neg pulmonary ROS,    Pulmonary exam normal breath sounds clear to auscultation       Cardiovascular negative cardio ROS Normal cardiovascular exam Rhythm:Regular Rate:Normal     Neuro/Psych  Headaches, negative psych ROS   GI/Hepatic negative GI ROS, Neg liver ROS,   Endo/Other  Hypothyroidism Morbid obesity  Renal/GU negative Renal ROS  negative genitourinary   Musculoskeletal negative musculoskeletal ROS (+)   Abdominal   Peds negative pediatric ROS (+)  Hematology negative hematology ROS (+)   Anesthesia Other Findings C/S for breech   Reproductive/Obstetrics (+) Pregnancy                             Anesthesia Physical Anesthesia Plan  ASA: III  Anesthesia Plan: Spinal   Post-op Pain Management:    Induction:   PONV Risk Score and Plan: Treatment may vary due to age or medical condition  Airway Management Planned: Natural Airway  Additional Equipment:   Intra-op Plan:   Post-operative Plan:   Informed Consent: I have reviewed the patients History and Physical, chart, labs and discussed the procedure including the risks, benefits and alternatives for the proposed anesthesia with the patient or authorized representative who has indicated his/her understanding and acceptance.     Dental advisory given  Plan Discussed with: CRNA  Anesthesia Plan Comments:         Anesthesia Quick Evaluation

## 2018-07-09 NOTE — Anesthesia Procedure Notes (Signed)
Spinal  Patient location during procedure: OR Start time: 07/09/2018 5:00 PM End time: 07/09/2018 5:10 PM Staffing Anesthesiologist: Elmer Picker, MD Performed: anesthesiologist  Preanesthetic Checklist Completed: patient identified, surgical consent, pre-op evaluation, timeout performed, IV checked, risks and benefits discussed and monitors and equipment checked Spinal Block Patient position: sitting Prep: site prepped and draped and DuraPrep Patient monitoring: cardiac monitor, continuous pulse ox and blood pressure Approach: midline Location: L3-4 Injection technique: single-shot Needle Needle type: Pencan  Needle gauge: 24 G Needle length: 9 cm Assessment Sensory level: T6 Additional Notes Functioning IV was confirmed and monitors were applied. Sterile prep and drape, including hand hygiene and sterile gloves were used. The patient was positioned and the spine was prepped. The skin was anesthetized with lidocaine.  Free flow of clear CSF was obtained prior to injecting local anesthetic into the CSF.  The spinal needle aspirated freely following injection.  The needle was carefully withdrawn.  The patient tolerated the procedure well.

## 2018-07-09 NOTE — Progress Notes (Signed)
Patient presented with elevated BPs. Given High Bp and past 37 weeks, delivery warranted for hypertension of pregnancy without severe features.  Platelets low but not <100k.  Patient still breech on [place] today. Has known uterine septum. Again consented and no other updates to h and P. 2g ancef on call to or.  Consent again signed.

## 2018-07-09 NOTE — Brief Op Note (Addendum)
07/09/2018  6:06 PM  PATIENT:  Christina Costa  28 y.o. female  PRE-OPERATIVE DIAGNOSIS:  breech, high blood pressure of pregnancy  POST-OPERATIVE DIAGNOSIS:  breech, high blood pressure of pregnancy  PROCEDURE:  Procedure(s) with comments: CESAREAN SECTION (N/A) - Primary  edc 07/27/18 NKDA need RNFA  SURGEON:  Surgeon(s) and Role:    * Ranae Pila, MD - Primary  PHYSICIAN ASSISTANT:   ASSISTANTS: none   ANESTHESIA:   spinal  EBL:  222 mL   BLOOD ADMINISTERED:none  DRAINS: Urinary Catheter (Foley)   LOCAL MEDICATIONS USED:  NONE  SPECIMEN:  Source of Specimen:  placenta  DISPOSITION OF SPECIMEN:  PATHOLOGY  COUNTS:  YES  TOURNIQUET:  * No tourniquets in log *  DICTATION: .Note written in EPIC  PLAN OF CARE: Admit to inpatient   PATIENT DISPOSITION:  PACU - hemodynamically stable.   Delay start of Pharmacological VTE agent (>24hrs) due to surgical blood loss or risk of bleeding: not applicable

## 2018-07-10 LAB — CBC
HCT: 34.9 % — ABNORMAL LOW (ref 36.0–46.0)
Hemoglobin: 11.4 g/dL — ABNORMAL LOW (ref 12.0–15.0)
MCH: 27.7 pg (ref 26.0–34.0)
MCHC: 32.7 g/dL (ref 30.0–36.0)
MCV: 84.9 fL (ref 80.0–100.0)
Platelets: 119 10*3/uL — ABNORMAL LOW (ref 150–400)
RBC: 4.11 MIL/uL (ref 3.87–5.11)
RDW: 13.8 % (ref 11.5–15.5)
WBC: 11.6 10*3/uL — ABNORMAL HIGH (ref 4.0–10.5)
nRBC: 0 % (ref 0.0–0.2)

## 2018-07-10 LAB — RPR: RPR Ser Ql: NONREACTIVE

## 2018-07-10 MED ORDER — LABETALOL HCL 5 MG/ML IV SOLN
INTRAVENOUS | Status: AC
  Filled 2018-07-10: qty 4

## 2018-07-10 NOTE — Lactation Note (Signed)
This note was copied from a baby's chart. Lactation Consultation Note  Patient Name: Christina Costa [id number] Date: 07/10/2018 Reason for consult: Initial assessment;1st time breastfeeding;Early term 37-38.6wks P1, 12 hour female infant, ETI, C/S delivery. Mom and Dad are both [employer] Employees mom given form to choose which DEBP she desires through [facility], mom will let [facility] services know of her choice so pump can be issued. Infant had one void since delivery. LC discussed hand expression and mom taught back infant took 1 ml of colostrum by spoon. Mom latched infant on left breast using football hold, infant was  chin to breast with nose touching, few swallows observed, infant breastfeeding 15 minutes and was still breastfeeding when LC left room.  Per mom, she developed trigger thumb in her pregnancy on left hand , she will try cross cradle hold more than football due difficulties bend thumb for latch.  Mom knows to breastfeed infant according hunger cues, 8 to 12 times within 24 hours and on demand. Mom knows to call Nurse or LC if she has any questions, concerns or need assistance with latching infant to breast.  LC discussed I & O. Reviewed Baby & Me book's Breastfeeding Basics.  Mom made aware of O/P services, breastfeeding support groups, community resources, and our phone # for post-discharge questions.  Maternal Data Formula Feeding for Exclusion: No Has patient been taught Hand Expression?: Yes(Infant given 1 ml of colostrum by spoon.) Does the patient have breastfeeding experience prior to this delivery?: No  Feeding Feeding Type: Breast Fed  LATCH Score Latch: Grasps breast easily, tongue down, lips flanged, rhythmical sucking.  Audible Swallowing: A few with stimulation  Type of Nipple: Everted at rest and after stimulation  Comfort (Breast/Nipple): Soft / non-tender  Hold (Positioning): Assistance needed to correctly position infant at breast and maintain  latch.  LATCH Score: 8  Interventions Interventions: Breast feeding basics reviewed;Assisted with latch;Skin to skin;Breast massage;Hand express;Adjust position;Support pillows;Breast compression;Expressed milk;Position options  Lactation Tools Discussed/Used WIC Program: No   Consult Status Consult Status: Follow-up Date: 07/10/18 Follow-up type: In-patient    Danelle Earthly 07/10/2018, 5:57 AM

## 2018-07-10 NOTE — Anesthesia Postprocedure Evaluation (Signed)
Anesthesia Post Note  Patient: Christina Costa  Procedure(s) Performed: CESAREAN SECTION (N/A )     Patient location during evaluation: Mother Baby Anesthesia Type: Spinal Level of consciousness: oriented and awake and alert Pain management: pain level controlled Vital Signs Assessment: post-procedure vital signs reviewed and stable Respiratory status: spontaneous breathing, respiratory function stable and patient connected to nasal cannula oxygen Cardiovascular status: blood pressure returned to baseline and stable Postop Assessment: no headache, no backache and no apparent nausea or vomiting Anesthetic complications: no    Last Vitals:  Vitals:   07/09/18 2345 07/10/18 0345  BP: 140/70 130/67  Pulse: 60 (!) 59  Resp: 18 18  Temp: 36.6 C 37 C  SpO2: 96% 96%    Last Pain:  Vitals:   07/10/18 0546  TempSrc:   PainSc: 0-No pain   Pain Goal:                   Trellis Paganini

## 2018-07-10 NOTE — Lactation Note (Signed)
This note was copied from a baby's chart. Lactation Consultation Note  Patient Name: Christina Costa [id number] Date: 07/10/2018 Reason for consult: Follow-up assessment;Early term 37-38.6wks;Infant weight loss P1, 28 hour female infant, infant weight loss -2%. Parents are [employer] Employees and Mom was given DEBP Freestyle. Per parents, infant had 4 voids but not a stool yet, infant  had a smear (tiny )  that nurse changed earlier today. Per mom, infant has started to cluster feeding now.  Nurse help with latching infant to breast  prior to Avala entering the room. Mom latched infant on right breast using cradle hold position, few swallows observed, infant was still breastfeeding (5 minutes) when LC left the room. Mom will continue to breastfeed according hunger cues and on demand due cluster feeding. Mom knows to breastfeed 8 to 12 times within 24 hours. Mom will continue to do as much STS as possible. Mom knows to call Nurse or LC if she has any questions, concerns or need assistance with latching infant to breast.   Maternal Data    Feeding Feeding Type: Breast Fed  LATCH Score Latch: Grasps breast easily, tongue down, lips flanged, rhythmical sucking.  Audible Swallowing: A few with stimulation  Type of Nipple: Everted at rest and after stimulation  Comfort (Breast/Nipple): Soft / non-tender  Hold (Positioning): Assistance needed to correctly position infant at breast and maintain latch.(Nurse assisted with latch)  LATCH Score: 8  Interventions    Lactation Tools Discussed/Used     Consult Status Consult Status: Follow-up Date: 07/11/18 Follow-up type: In-patient    Danelle Earthly 07/10/2018, 10:09 PM

## 2018-07-10 NOTE — Progress Notes (Signed)
Subjective: Postpartum Day 1: Cesarean Delivery s/s breech presentation and PIH Patient reports tolerating PO.    Objective: Vital signs in last 24 hours: Temp:  [97.5 F (36.4 C)-98.8 F (37.1 C)] 98.6 F (37 C) (05/30 0345) Pulse Rate:  [59-124] 59 (05/30 0345) Resp:  [13-31] 18 (05/30 0345) BP: (125-146)/(50-97) 130/67 (05/30 0345) SpO2:  [96 %-100 %] 96 % (05/30 0345) Weight:  [111.1 kg] 111.1 kg (05/29 1243)  Physical Exam:  General: alert, cooperative and appears stated age Lochia: appropriate Uterine Fundus: firm Incision: healing well, no significant drainage, no dehiscence, no significant erythema DVT Evaluation: No evidence of DVT seen on physical exam. Negative Homan's sign. No cords or calf tenderness. No significant calf/ankle edema.  Recent Labs    07/09/18 1246 07/10/18 0557  HGB 13.1 11.4*  HCT 40.2 34.9*    Assessment/Plan: Status post Cesarean section. Doing well postoperatively.  Continue current care. PLTs stably low. BP normal. No s/s severe PIH.    Ranae Pila 07/10/2018, 7:51 AM

## 2018-07-11 NOTE — Lactation Note (Addendum)
This note was copied from a baby's chart. Lactation Consultation Note  Patient Name: Christina Costa [id number] Date: 07/11/2018   P1, Baby 47 hours old.  Baby 37w3 days.  Parents [employer] Employees. Reviewed hand expression with drops expressed. Mother has abrasion on tip of L nipple.  It is shorter shafted than R nipple and could be cause of increased pain of L side.  L nipple was less evert and now with sucking/tugging it is tender. For soreness suggest mother apply ebm or coconut oil while wearing shells and alternate with comfort gels. Had mother prepump w/ manual pump.  Observed bf of both breasts.  L nipple feels like biting.  Applied #20 & #24 NS but it did not relieve pain.  Baby then latched on R side and sustained latch for 25 min with intermittent swallows with no pain.  Due to only 1 stool since birth, 6 voids and early term set up DEBP. Recommend mother post pump 4-6 times per day for 10-20 min with DEBP on initiation setting. Give baby back volume pumped at the next feeding. Reviewed cleaning and milk storage.        Maternal Data    Feeding    LATCH Score                   Interventions    Lactation Tools Discussed/Used     Consult Status      [name] 07/11/2018, 5:33 PM

## 2018-07-11 NOTE — Progress Notes (Signed)
Subjective: Postpartum Day 2: Cesarean Delivery s/s breech presentation and PIH Patient reports tolerating PO, + flatus and no problems voiding.    Objective: Vital signs in last 24 hours: Temp:  [98.2 F (36.8 C)-98.6 F (37 C)] 98.6 F (37 C) (05/31 0510) Pulse Rate:  [68-71] 69 (05/31 0510) Resp:  [16-18] 18 (05/31 0510) BP: (110-135)/(64-86) 133/86 (05/31 0510)  Physical Exam:  General: alert, cooperative and appears stated age 28: appropriate Uterine Fundus: firm Incision: healing well, no significant drainage, no dehiscence, no significant erythema DVT Evaluation: No evidence of DVT seen on physical exam. Negative Homan's sign. No cords or calf tenderness. No significant calf/ankle edema.  Recent Labs    07/09/18 1246 07/10/18 0557  HGB 13.1 11.4*  HCT 40.2 34.9*    Assessment/Plan: Status post Cesarean section. Doing well postoperatively.  Continue current care. Change dressing after showering as some stable strike-through not passing marked areas.    Ranae Pila 07/11/2018, 8:51 AM

## 2018-07-12 ENCOUNTER — Encounter (HOSPITAL_COMMUNITY): Payer: Self-pay | Admitting: *Deleted

## 2018-07-12 MED ORDER — PRENATAL MULTIVITAMIN CH: 1.0000 | ORAL_TABLET | Freq: Every day | ORAL | 1 refills | Status: DC

## 2018-07-12 MED ORDER — ACETAMINOPHEN 500 MG PO TABS: 1000.0000 mg | ORAL_TABLET | Freq: Four times a day (QID) | ORAL | 0 refills | Status: DC | PRN

## 2018-07-12 NOTE — Lactation Note (Addendum)
This note was copied from a baby's chart. Lactation Consultation Note  Patient Name: Christina Costa [id number] Date: 07/12/2018 Reason for consult: Follow-up assessment;Primapara;1st time breastfeeding;Nipple pain/trauma  Visited with P1 Mom of ET infant at 6% weight loss on day of discharge, baby 83 hrs old.  Mom started pumping due to fullness and discomfort in right breast more than left.  Mom pumped 2 oz this am.   Baby fed for 10 mins this am, and Mom fed baby 23 ml of EBM by slow flow bottle, as baby was sleepy on the breast and breasts filling . Baby swaddled in blanket sleeping.   It had been over 3 hrs since last feeding, suggested Mom unswaddle baby and place her STS.  Baby immediately started cueing. Assisted Mom with football hold on right breast, and baby easily latched.  Nipples more erect due to wearing shells between feedings. Showed FOB how to assess lower lip and offer a gentle chin tug to flange lips and widen latch.  Baby swallowing with most every suck 1:1 ratio.  Breast softening. Baby looks jaundiced, TCB 11.9.  Baby at 6% weight loss, output 4 voids and 1 stool last 24 hrs.    Plan- 1- Keep baby STS as much as possible 2- Offer breast with cues, goal of >8 feedings per 24 hrs 3- Offer 15-30 ml EBM by paced bottle  4-Pump both breasts 15 mins, using breast massage and hand expression as well. 5- Follow-up with OP lactation appointment in next couple days.  Reviewed engorgement prevention and treatment.  Mom aware of OP lactation support.  Request made to clinic for OP lactation appointment.  Feeding Feeding Type: Breast Fed  LATCH Score Latch: Grasps breast easily, tongue down, lips flanged, rhythmical sucking.  Audible Swallowing: Spontaneous and intermittent  Type of Nipple: Everted at rest and after stimulation  Comfort (Breast/Nipple): Filling, red/small blisters or bruises, mild/mod discomfort Right breast full, and left breast sore nipple  Hold  (Positioning): Assistance needed to correctly position infant at breast and maintain latch.  LATCH Score: 8  Interventions Interventions: Breast feeding basics reviewed;Assisted with latch;Skin to skin;Breast massage;Breast compression;Pre-pump if needed;Hand express;Adjust position;Support pillows;Position options;Expressed milk;Coconut oil;Shells;Hand pump;DEBP  Lactation Tools Discussed/Used Tools: Shells;Pump;Bottle;Coconut oil Shell Type: Inverted Breast pump type: Double-Electric Breast Pump;Manual   Consult Status Consult Status: Complete Date: 07/12/18 Follow-up type: Out-patient    Judee Clara 07/12/2018, 8:26 AM

## 2018-07-12 NOTE — Discharge Summary (Signed)
Obstetric Discharge Summary Reason for Admission: cesarean section Prenatal Procedures: none Intrapartum Procedures: cesarean: low cervical, transverse Postpartum Procedures: none Complications-Operative and Postpartum: none Hemoglobin  Date Value Ref Range Status  07/10/2018 11.4 (L) 12.0 - 15.0 g/dL Final   HCT  Date Value Ref Range Status  07/10/2018 34.9 (L) 36.0 - 46.0 % Final    Physical Exam:  General: alert, cooperative and no distress Lochia: appropriate Uterine Fundus: firm Incision: healing well DVT Evaluation: No evidence of DVT seen on physical exam.  Discharge Diagnoses: gestational hypertension  Discharge Information: Date: 07/12/2018 Activity: pelvic rest Diet: routine Medications: PNV and Tylenol Condition: stable Instructions: refer to practice specific booklet Discharge to: home   Newborn Data: Live born female  Birth Weight: 6 lb 8.2 oz (2955 g) APGAR: 9, 9  Newborn Delivery   Birth date/time:  [birth year]/09/2018 17:33:00 Delivery type:  C-Section, Low Transverse Trial of labor:  No C-section categorization:  Primary     Home with mother.  Christina Costa 07/12/2018, 8:39 AM

## 2018-07-16 ENCOUNTER — Other Ambulatory Visit (HOSPITAL_COMMUNITY): Admission: RE | Admit: 2018-07-16 | Payer: 59 | Source: Ambulatory Visit

## 2018-08-17 DIAGNOSIS — Z309 Encounter for contraceptive management, unspecified: Secondary | ICD-10-CM | POA: Diagnosis not present

## 2018-08-17 DIAGNOSIS — Z1389 Encounter for screening for other disorder: Secondary | ICD-10-CM | POA: Diagnosis not present

## 2018-08-17 DIAGNOSIS — B372 Candidiasis of skin and nail: Secondary | ICD-10-CM | POA: Diagnosis not present

## 2018-10-07 NOTE — Telephone Encounter (Signed)
err

## 2018-11-08 DIAGNOSIS — M25531 Pain in right wrist: Secondary | ICD-10-CM | POA: Diagnosis not present

## 2018-11-08 DIAGNOSIS — M25532 Pain in left wrist: Secondary | ICD-10-CM | POA: Diagnosis not present

## 2018-11-11 DIAGNOSIS — M654 Radial styloid tenosynovitis [de Quervain]: Secondary | ICD-10-CM | POA: Diagnosis not present

## 2018-11-11 DIAGNOSIS — M25532 Pain in left wrist: Secondary | ICD-10-CM | POA: Diagnosis not present

## 2018-11-19 DIAGNOSIS — M654 Radial styloid tenosynovitis [de Quervain]: Secondary | ICD-10-CM | POA: Diagnosis not present

## 2018-12-03 DIAGNOSIS — M654 Radial styloid tenosynovitis [de Quervain]: Secondary | ICD-10-CM | POA: Diagnosis not present

## 2018-12-31 DIAGNOSIS — M654 Radial styloid tenosynovitis [de Quervain]: Secondary | ICD-10-CM | POA: Diagnosis not present

## 2019-01-10 DIAGNOSIS — M25532 Pain in left wrist: Secondary | ICD-10-CM | POA: Diagnosis not present

## 2019-01-11 HISTORY — PX: HAND SURGERY: SHX662

## 2019-01-17 DIAGNOSIS — M654 Radial styloid tenosynovitis [de Quervain]: Secondary | ICD-10-CM | POA: Diagnosis not present

## 2019-01-28 DIAGNOSIS — M654 Radial styloid tenosynovitis [de Quervain]: Secondary | ICD-10-CM | POA: Diagnosis not present

## 2019-01-28 DIAGNOSIS — Z4789 Encounter for other orthopedic aftercare: Secondary | ICD-10-CM | POA: Diagnosis not present

## 2019-03-28 DIAGNOSIS — M25532 Pain in left wrist: Secondary | ICD-10-CM | POA: Diagnosis not present

## 2019-05-03 DIAGNOSIS — M654 Radial styloid tenosynovitis [de Quervain]: Secondary | ICD-10-CM | POA: Diagnosis not present

## 2019-06-22 DIAGNOSIS — H5213 Myopia, bilateral: Secondary | ICD-10-CM | POA: Diagnosis not present

## 2019-07-21 ENCOUNTER — Other Ambulatory Visit (HOSPITAL_COMMUNITY): Payer: Self-pay | Admitting: Obstetrics and Gynecology

## 2019-07-21 DIAGNOSIS — N926 Irregular menstruation, unspecified: Secondary | ICD-10-CM | POA: Diagnosis not present

## 2019-07-21 DIAGNOSIS — Z6839 Body mass index (BMI) 39.0-39.9, adult: Secondary | ICD-10-CM | POA: Diagnosis not present

## 2019-07-21 DIAGNOSIS — Z01419 Encounter for gynecological examination (general) (routine) without abnormal findings: Secondary | ICD-10-CM | POA: Diagnosis not present

## 2019-07-21 DIAGNOSIS — R4586 Emotional lability: Secondary | ICD-10-CM | POA: Diagnosis not present

## 2019-08-22 ENCOUNTER — Ambulatory Visit (INDEPENDENT_AMBULATORY_CARE_PROVIDER_SITE_OTHER): Payer: 59 | Admitting: Nurse Practitioner

## 2019-08-22 ENCOUNTER — Encounter: Payer: Self-pay | Admitting: Nurse Practitioner

## 2019-08-22 ENCOUNTER — Other Ambulatory Visit: Payer: Self-pay

## 2019-08-22 VITALS — BP 128/72 | HR 86 | Temp 98.2°F | Ht 66.0 in | Wt 246.0 lb

## 2019-08-22 DIAGNOSIS — E669 Obesity, unspecified: Secondary | ICD-10-CM | POA: Diagnosis not present

## 2019-08-22 DIAGNOSIS — E039 Hypothyroidism, unspecified: Secondary | ICD-10-CM

## 2019-08-22 DIAGNOSIS — G8929 Other chronic pain: Secondary | ICD-10-CM | POA: Diagnosis not present

## 2019-08-22 DIAGNOSIS — Z0000 Encounter for general adult medical examination without abnormal findings: Secondary | ICD-10-CM

## 2019-08-22 DIAGNOSIS — E079 Disorder of thyroid, unspecified: Secondary | ICD-10-CM | POA: Diagnosis not present

## 2019-08-22 DIAGNOSIS — R519 Headache, unspecified: Secondary | ICD-10-CM | POA: Diagnosis not present

## 2019-08-22 LAB — POCT URINALYSIS DIPSTICK
Bilirubin, UA: NEGATIVE
Blood, UA: NEGATIVE
Glucose, UA: NEGATIVE
Ketones, UA: NEGATIVE
Leukocytes, UA: NEGATIVE
Nitrite, UA: NEGATIVE
Protein, UA: NEGATIVE
Spec Grav, UA: >=1.03 — AB (ref 1.010–1.025)
Urobilinogen, UA: 0.2 E.U./dL
pH, UA: 5 (ref 5.0–8.0)

## 2019-08-22 LAB — POCT GLYCOSYLATED HEMOGLOBIN (HGB A1C)
HbA1c POC (<> result, manual entry): 5.6 % (ref 4.0–5.6)
HbA1c, POC (controlled diabetic range): 5.6 % (ref 0.0–7.0)
HbA1c, POC (prediabetic range): 5.6 % — AB (ref 5.7–6.4)
Hemoglobin A1C: 5.6 % (ref 4.0–5.6)

## 2019-08-22 LAB — GLUCOSE, POCT (MANUAL RESULT ENTRY): POC Glucose: 85 mg/dl (ref 70–99)

## 2019-08-22 NOTE — Progress Notes (Signed)
[facility] Patient [facility] [address] [place], [place]  [address] Phone:  [phone]   Fax:  [phone]    New Patient Office Visit  Subjective:  Patient ID: Christina Costa, female    DOB: 03/25/1990  Age: 29 y.o. MRN: [id number]  CC:  Chief Complaint  Patient presents with  . Establish Care    Hypothyroidism; Taking daily last rx from Dr. [facility]  . Migraine    Has been dealing them for years; have gotten worse since having daughter    HPI Christina Costa presents to establish care. She  has a past medical history of Headache, Hypothyroidism, and Seasonal allergies.   Hypothyroidism Patient presents for evaluation of thyroid function. Symptoms consist of fatigue, questionable postpartum depression; emotional, furstrated. Symptoms have present for several years. The symptoms are moderate.  The problem has been gradually worsening.  Previous thyroid studies include TSH, T3 uptake and total T4. The hypothyroidism is due to hypothyroidism. She has been off her medication for about one month. She felt like she was fine until 2-3 months after breast feeding. She feels like was hormonal. Family history mom of grandmother hypothyroidism. She denies any family history of depression and anxiety.  She admits that she had "a roller costater" pregnancy. She suffered from "elevated BP".   Headache Patient presents for evaluation of headache. Symptoms began about several months ago. Generally, the headaches last about 1 day and occur several times per month. The headaches do not seem to be related to any time of the day. The headaches are usually mild and are located in varies.  The patient rates her most severe headaches a varies on a scale from 1 to 10. Recently, the headaches have been increasing in frequency. Work attendance or other daily activities are not affected by the headaches. Precipitating factors include: menses and stress. The headaches are usually not  preceded by an aura. Associated neurologic symptoms:maybe some visual sensitvity and double vision. The patient denies loss of balance, muscle weakness, numbness of extremities, speech difficulties and vomiting in the early morning. Home treatment has included acetaminophen Excedrin Migraine and resting with some improvement. Other history includes: nothing pertinent. Family history includes no known family members with significant headaches.   Past Medical History:  Diagnosis Date  . Headache   . Hypothyroidism    since age 68  . Seasonal allergies     Past Surgical History:  Procedure Laterality Date  . CESAREAN SECTION N/A 07/09/2018   Procedure: CESAREAN SECTION;  Surgeon: Ranae Pila, MD;  Location: [facility] LD ORS;  Service: Obstetrics;  Laterality: N/A;  Primary  edc 07/27/18 NKDA need RNFA  . DILATION AND EVACUATION N/A 09/16/2017   Procedure: DILATATION AND EVACUATION;  Surgeon: Ranae Pila, MD;  Location: [facility] ORS;  Service: Gynecology;  Laterality: N/A;  . HAND SURGERY  01/2019  . permanent retainers     upper and lower   . WISDOM TOOTH EXTRACTION      Family History  Problem Relation Age of Onset  . Allergy (severe) Mother   . Hypothyroidism Mother   . Arthritis Maternal Grandmother   . Heart disease Maternal Grandfather     Social History   Socioeconomic History  . Marital status: Married    Spouse name: Not on file  . Number of children: Not on file  . Years of education: Not on file  . Highest education level: Not on file  Occupational History  .  Not on file  Tobacco Use  . Smoking status: Never Smoker  . Smokeless tobacco: Never Used  Vaping Use  . Vaping Use: Never used  Substance and Sexual Activity  . Alcohol use: No  . Drug use: No  . Sexual activity: Yes    Birth control/protection: None  Other Topics Concern  . Not on file  Social History Narrative  . Not on file   Social Determinants of Health   Financial Resource Strain:     . Difficulty of Paying Living Expenses:   Food Insecurity:   . Worried About Programme researcher, broadcasting/film/video in the Last Year:   . Barista in the Last Year:   Transportation Needs:   . Freight forwarder (Medical):   Marland Kitchen Lack of Transportation (Non-Medical):   Physical Activity:   . Days of Exercise per Week:   . Minutes of Exercise per Session:   Stress:   . Feeling of Stress :   Social Connections:   . Frequency of Communication with Friends and Family:   . Frequency of Social Gatherings with Friends and Family:   . Attends Religious Services:   . Active Member of Clubs or Organizations:   . Attends Banker Meetings:   Marland Kitchen Marital Status:   Intimate Partner Violence:   . Fear of Current or Ex-Partner:   . Emotionally Abused:   Marland Kitchen Physically Abused:   . Sexually Abused:     ROS Review of Systems  All other systems reviewed and are negative.   Objective:   Today's Vitals: BP 128/72   Pulse 86   Temp 98.2 F (36.8 C)   Ht 5\' 6"  (1.676 m)   Wt 246 lb 0.2 oz (111.6 kg)   LMP 08/05/2019   SpO2 100%   Breastfeeding No   BMI 39.71 kg/m   Physical Exam Constitutional:      General: She is not in acute distress.    Appearance: She is obese.  HENT:     Head: Normocephalic and atraumatic.     Nose: Nose normal.     Mouth/Throat:     Mouth: Mucous membranes are moist.  Cardiovascular:     Rate and Rhythm: Normal rate and regular rhythm.     Pulses: Normal pulses.     Heart sounds: Normal heart sounds.  Pulmonary:     Effort: Pulmonary effort is normal.     Breath sounds: Normal breath sounds.  Abdominal:     General: Bowel sounds are normal.     Palpations: Abdomen is soft.  Musculoskeletal:        General: Normal range of motion.     Cervical back: Normal range of motion.  Skin:    General: Skin is warm and dry.     Capillary Refill: Capillary refill takes less than 2 seconds.  Neurological:     General: No focal deficit present.     Mental  Status: She is alert and oriented to person, place, and time.  Psychiatric:        Mood and Affect: Mood normal.        Thought Content: Thought content normal.        Judgment: Judgment normal.     Assessment & Plan:   Problem List Items Addressed This Visit      Endocrine   Hypothyroidism   Relevant Medications   levothyroxine (SYNTHROID) 75 MCG tablet Labs pending     Other   Obesity (BMI  30-39.9) Discussed lifestyle modification of diet and exercise    Other Visit Diagnoses    Healthcare maintenance    -  Primary   Relevant Orders   Urinalysis Dipstick (Completed)   HgB A1c (Completed)   Glucose (CBG) (Completed)   Chronic nonintractable headache, unspecified headache type     Discussed headache diary Possible referral to headache clinic in the future if needed.      Outpatient Encounter Medications as of 08/22/2019  Medication Sig  . acetaminophen (TYLENOL) 500 MG tablet Take 2 tablets (1,000 mg total) by mouth every 6 (six) hours as needed.  . Prenatal Vit-Fe Fumarate-FA (PRENATAL MULTIVITAMIN) TABS tablet Take 1 tablet by mouth daily at 12 noon.  . [DISCONTINUED] levothyroxine (SYNTHROID, LEVOTHROID) 100 MCG tablet Take 1 tablet (100 mcg total) by mouth daily. (Patient taking differently: Take 75 mcg by mouth daily before breakfast. )  . levothyroxine (SYNTHROID) 75 MCG tablet Take 75 mcg by mouth daily.  . [DISCONTINUED] fluticasone (FLONASE) 50 MCG/ACT nasal spray Place 2 sprays into both nostrils daily.   No facility-administered encounter medications on file as of 08/22/2019.    Follow-up: Return in about 3 months (around 11/22/2019).   Barbette Merino, NP

## 2019-08-22 NOTE — Patient Instructions (Signed)
Hypothyroidism  Hypothyroidism is when the thyroid gland does not make enough of certain hormones (it is underactive). The thyroid gland is a small gland located in the lower front part of the neck, just in front of the windpipe (trachea). This gland makes hormones that help control how the body uses food for energy (metabolism) as well as how the heart and brain function. These hormones also play a role in keeping your bones strong. When the thyroid is underactive, it produces too little of the hormones thyroxine (T4) and triiodothyronine (T3). What are the causes? This condition may be caused by:  Hashimoto's disease. This is a disease in which the body's disease-fighting system (immune system) attacks the thyroid gland. This is the most common cause.  Viral infections.  Pregnancy.  Certain medicines.  Birth defects.  Past radiation treatments to the head or neck for cancer.  Past treatment with radioactive iodine.  Past exposure to radiation in the environment.  Past surgical removal of part or all of the thyroid.  Problems with a gland in the center of the brain (pituitary gland).  Lack of enough iodine in the diet. What increases the risk? You are more likely to develop this condition if:  You are female.  You have a family history of thyroid conditions.  You use a medicine called lithium.  You take medicines that affect the immune system (immunosuppressants). What are the signs or symptoms? Symptoms of this condition include:  Feeling as though you have no energy (lethargy).  Not being able to tolerate cold.  Weight gain that is not explained by a change in diet or exercise habits.  Lack of appetite.  Dry skin.  Coarse hair.  Menstrual irregularity.  Slowing of thought processes.  Constipation.  Sadness or depression. How is this diagnosed? This condition may be diagnosed based on:  Your symptoms, your medical history, and a physical exam.  Blood  tests. You may also have imaging tests, such as an ultrasound or MRI. How is this treated? This condition is treated with medicine that replaces the thyroid hormones that your body does not make. After you begin treatment, it may take several weeks for symptoms to go away. Follow these instructions at home:  Take over-the-counter and prescription medicines only as told by your health care provider.  If you start taking any new medicines, tell your health care provider.  Keep all follow-up visits as told by your health care provider. This is important. ? As your condition improves, your dosage of thyroid hormone medicine may change. ? You will need to have blood tests regularly so that your health care provider can monitor your condition. Contact a health care provider if:  Your symptoms do not get better with treatment.  You are taking thyroid replacement medicine and you: ? Sweat a lot. ? Have tremors. ? Feel anxious. ? Lose weight rapidly. ? Cannot tolerate heat. ? Have emotional swings. ? Have diarrhea. ? Feel weak. Get help right away if you have:  Chest pain.  An irregular heartbeat.  A rapid heartbeat.  Difficulty breathing. Summary  Hypothyroidism is when the thyroid gland does not make enough of certain hormones (it is underactive).  When the thyroid is underactive, it produces too little of the hormones thyroxine (T4) and triiodothyronine (T3).  The most common cause is Hashimoto's disease, a disease in which the body's disease-fighting system (immune system) attacks the thyroid gland. The condition can also be caused by viral infections, medicine, pregnancy, or past   radiation treatment to the head or neck.  Symptoms may include weight gain, dry skin, constipation, feeling as though you do not have energy, and not being able to tolerate cold.  This condition is treated with medicine to replace the thyroid hormones that your body does not make. This information  is not intended to replace advice given to you by your health care provider. Make sure you discuss any questions you have with your health care provider. Document Revised: 01/09/2017 Document Reviewed: 01/07/2017 Elsevier Patient Education  2020 [employer].   Migraine Headache A migraine headache is a very strong throbbing pain on one side or both sides of your head. This type of headache can also cause other symptoms. It can last from 4 hours to 3 days. Talk with your doctor about what things may bring on (trigger) this condition. What are the causes? The exact cause of this condition is not known. This condition may be triggered or caused by: Drinking alcohol. Smoking. Taking medicines, such as: Medicine used to treat chest pain (nitroglycerin). Birth control pills. Estrogen. Some blood pressure medicines. Eating or drinking certain products. Doing physical activity. Other things that may trigger a migraine headache include: Having a menstrual period. Pregnancy. Hunger. Stress. Not getting enough sleep or getting too much sleep. Weather changes. Tiredness (fatigue). What increases the risk? Being 33-28 years old. Being female. Having a family history of migraine headaches. Being Caucasian. Having depression or anxiety. Being very overweight. What are the signs or symptoms? A throbbing pain. This pain may: Happen in any area of the head, such as on one side or both sides. Make it hard to do daily activities. Get worse with physical activity. Get worse around bright lights or loud noises. Other symptoms may include: Feeling sick to your stomach (nauseous). Vomiting. Dizziness. Being sensitive to bright lights, loud noises, or smells. Before you get a migraine headache, you may get warning signs (an aura). An aura may include: Seeing flashing lights or having blind spots. Seeing bright spots, halos, or zigzag lines. Having tunnel vision or blurred vision. Having  numbness or a tingling feeling. Having trouble talking. Having weak muscles. Some people have symptoms after a migraine headache (postdromal phase), such as: Tiredness. Trouble thinking (concentrating). How is this treated? Taking medicines that: Relieve pain. Relieve the feeling of being sick to your stomach. Prevent migraine headaches. Treatment may also include: Having acupuncture. Avoiding foods that bring on migraine headaches. Learning ways to control your body functions (biofeedback). Therapy to help you know and deal with negative thoughts (cognitive behavioral therapy). Follow these instructions at home: Medicines Take over-the-counter and prescription medicines only as told by your doctor. Ask your doctor if the medicine prescribed to you: Requires you to avoid driving or using heavy machinery. Can cause trouble pooping (constipation). You may need to take these steps to prevent or treat trouble pooping: Drink enough fluid to keep your pee (urine) pale yellow. Take over-the-counter or prescription medicines. Eat foods that are high in fiber. These include beans, whole grains, and fresh fruits and vegetables. Limit foods that are high in fat and sugar. These include fried or sweet foods. Lifestyle Do not drink alcohol. Do not use any products that contain nicotine or tobacco, such as cigarettes, e-cigarettes, and chewing tobacco. If you need help quitting, ask your doctor. Get at least 8 hours of sleep every night. Limit and deal with stress. General instructions     Keep a journal to find out what may bring on  your migraine headaches. For example, write down: What you eat and drink. How much sleep you get. Any change in what you eat or drink. Any change in your medicines. If you have a migraine headache: Avoid things that make your symptoms worse, such as bright lights. It may help to lie down in a dark, quiet room. Do not drive or use heavy machinery. Ask your  doctor what activities are safe for you. Keep all follow-up visits as told by your doctor. This is important. Contact a doctor if: You get a migraine headache that is different or worse than others you have had. You have more than 15 headache days in one month. Get help right away if: Your migraine headache gets very bad. Your migraine headache lasts longer than 72 hours. You have a fever. You have a stiff neck. You have trouble seeing. Your muscles feel weak or like you cannot control them. You start to lose your balance a lot. You start to have trouble walking. You pass out (faint). You have a seizure. Summary A migraine headache is a very strong throbbing pain on one side or both sides of your head. These headaches can also cause other symptoms. This condition may be treated with medicines and changes to your lifestyle. Keep a journal to find out what may bring on your migraine headaches. Contact a doctor if you get a migraine headache that is different or worse than others you have had. Contact your doctor if you have more than 15 headache days in a month. This information is not intended to replace advice given to you by your health care provider. Make sure you discuss any questions you have with your health care provider. Document Revised: 05/21/2018 Document Reviewed: 03/11/2018 Elsevier Patient Education  2020 [employer].

## 2019-08-23 LAB — COMP. METABOLIC PANEL (12)
AST: 23 IU/L (ref 0–40)
Albumin/Globulin Ratio: 1.7 (ref 1.2–2.2)
Albumin: 4.6 g/dL (ref 3.9–5.0)
Alkaline Phosphatase: 70 IU/L (ref 48–121)
BUN/Creatinine Ratio: 15 (ref 9–23)
BUN: 12 mg/dL (ref 6–20)
Bilirubin Total: 0.2 mg/dL (ref 0.0–1.2)
Calcium: 9.8 mg/dL (ref 8.7–10.2)
Chloride: 101 mmol/L (ref 96–106)
Creatinine, Ser: 0.8 mg/dL (ref 0.57–1.00)
GFR calc Af Amer: 116 mL/min/{1.73_m2} (ref >59)
GFR calc non Af Amer: 101 mL/min/{1.73_m2} (ref >59)
Globulin, Total: 2.7 g/dL (ref 1.5–4.5)
Glucose: 84 mg/dL (ref 65–99)
Potassium: 4.4 mmol/L (ref 3.5–5.2)
Sodium: 138 mmol/L (ref 134–144)
Total Protein: 7.3 g/dL (ref 6.0–8.5)

## 2019-08-23 LAB — TSH: TSH: 5.82 u[IU]/mL — ABNORMAL HIGH (ref 0.450–4.500)

## 2019-08-23 LAB — T3: T3, Total: 126 ng/dL (ref 71–180)

## 2019-08-23 LAB — T4, FREE: Free T4: 1.21 ng/dL (ref 0.82–1.77)

## 2019-08-26 ENCOUNTER — Other Ambulatory Visit: Payer: Self-pay | Admitting: Nurse Practitioner

## 2019-08-26 DIAGNOSIS — E039 Hypothyroidism, unspecified: Secondary | ICD-10-CM

## 2019-11-11 ENCOUNTER — Other Ambulatory Visit: Payer: Self-pay | Admitting: Emergency Medicine

## 2019-11-11 ENCOUNTER — Telehealth: Payer: 59 | Admitting: Emergency Medicine

## 2019-11-11 DIAGNOSIS — L709 Acne, unspecified: Secondary | ICD-10-CM

## 2019-11-11 MED ORDER — DOXYCYCLINE HYCLATE 100 MG PO CAPS
100.0000 mg | ORAL_CAPSULE | Freq: Two times a day (BID) | ORAL | 0 refills | Status: DC
Start: 2019-11-11 — End: 2019-11-11

## 2019-11-11 NOTE — Progress Notes (Signed)
We are sorry that you are experiencing this issue.  Here is how we plan to help!  Based on what you shared with me it looks like you have uncomplicated acne.  Acne is a disorder of the hair follicles and oil glands (sebaceous glands). The sebaceous glands secrete oils to keep the skin moist.  When the glands get clogged, it can lead to pimples or cysts.  These cysts may become infected and leave scars. Acne is very common and normally occurs at puberty.  Acne is also inherited.  Your personal care plan consists of the following recommendations:  I recommend that you use a daily cleanser  You might try an over the counter cleanser that has benzoyl peroxide.  I recommend that you start with a product that has 2.5% benzoyl peroxide.  Stronger concentrations have not been shown to be more effective.  I have also prescribed one of the following additional therapies:  Doxycycline an oral antibiotic 100 mg twice a day, this should clear the infection.  If excessive dryness or peeling occurs, reduce dose frequency or concentration of the topical scrubs.  If excessive stinging or burning occurs, remove the topical gel with mild soap and water and resume at a lower dose the next day.  Remember oral antibiotics and topical acne treatments may increase your sensitivity to the sun!  HOME CARE:  Do not squeeze pimples because that can often lead to infections, worse acne, and scars.  Use a moisturizer that contains retinoid or fruit acids that may inhibit the development of new acne lesions.  Although there is not a clear link that foods can cause acne, doctors do believe that too many sweets predispose you to skin problems.  GET HELP RIGHT AWAY IF:  If your acne gets worse or is not better within 10 days.  If you become depressed.  If you become pregnant, discontinue medications and call your OB/GYN.  MAKE SURE YOU:  Understand these instructions.  Will watch your condition.  Will get help  right away if you are not doing well or get worse.   Your e-visit answers were reviewed by a board certified advanced clinical practitioner to complete your personal care plan.  Depending upon the condition, your plan could have included both over the counter or prescription medications.  Please review your pharmacy choice.  If there is a problem, you may contact your provider through [employer] and have the prescription routed to another pharmacy.  Your safety is important to [place].  If you have drug allergies check your prescription carefully.  For the next 24 hours you can use MyChart to ask questions about today's visit, request a non-urgent call back, or ask for a work or school excuse from your e-visit provider.  You will get an email in the next two days asking about your experience. I hope that your e-visit has been valuable and will speed your recovery.  Approximately 5 minutes was used in reviewing the patient's chart, questionnaire, prescribing medications, and documentation.

## 2019-11-21 ENCOUNTER — Ambulatory Visit (INDEPENDENT_AMBULATORY_CARE_PROVIDER_SITE_OTHER): Payer: 59 | Admitting: Nurse Practitioner

## 2019-11-21 ENCOUNTER — Encounter: Payer: Self-pay | Admitting: Nurse Practitioner

## 2019-11-21 ENCOUNTER — Telehealth: Payer: Self-pay

## 2019-11-21 ENCOUNTER — Other Ambulatory Visit: Payer: Self-pay

## 2019-11-21 VITALS — BP 134/88 | HR 80 | Temp 97.6°F | Ht 66.0 in | Wt 254.0 lb

## 2019-11-21 DIAGNOSIS — L309 Dermatitis, unspecified: Secondary | ICD-10-CM | POA: Diagnosis not present

## 2019-11-21 DIAGNOSIS — L0201 Cutaneous abscess of face: Secondary | ICD-10-CM

## 2019-11-21 NOTE — Telephone Encounter (Signed)
Faxed referral and office notes to [place] in [place]

## 2019-11-21 NOTE — Progress Notes (Signed)
[facility] Patient [facility] [address] [place], [place]  [address] Phone:  [phone]   Fax:  [phone]   Established Patient Office Visit  Subjective:  Patient ID: Christina Costa, female    DOB: [birth year]  Age: 29 y.o. MRN: [id number]  CC:  Chief Complaint  Patient presents with  . Follow-up    still have spot underneath chin, have nodule under right arm.     Nodule under right arm has been  for about two weeks.     Want to discuss those    HPI Chenae Brager presents for evaluation. She  has a past medical history of Headache, Hypothyroidism, hypothyroidisn, and Seasonal allergies.   Abscess Patient presents for evaluation of a cutaneous abscess. Lesion is located in the right chin and right upper back. Onset was 1 days ago. Symptoms have gradually improved. Abscess has associated symptoms of spontaneous drainage, pain. Patient does not have previous history of cutaneous abscesses. Patient does not have diabetes.  Past Medical History:  Diagnosis Date  . Headache   . Hypothyroidism   . hypothyroidisn    since age 68  . Seasonal allergies     Past Surgical History:  Procedure Laterality Date  . CESAREAN SECTION N/A 07/09/2018   Procedure: CESAREAN SECTION;  Surgeon: Ranae Pila, MD;  Location: [facility] LD ORS;  Service: Obstetrics;  Laterality: N/A;  Primary  edc 07/27/18 NKDA need RNFA  . DILATION AND EVACUATION N/A 09/16/2017   Procedure: DILATATION AND EVACUATION;  Surgeon: Ranae Pila, MD;  Location: [facility] ORS;  Service: Gynecology;  Laterality: N/A;  . HAND SURGERY  01/2019  . permanent retainers     upper and lower   . WISDOM TOOTH EXTRACTION      Family History  Problem Relation Age of Onset  . Allergy (severe) Mother   . Hypothyroidism Mother   . Arthritis Maternal Grandmother   . Heart disease Maternal Grandfather     Social History   Socioeconomic History  . Marital status: Married    Spouse name: Not on file  . Number of  children: Not on file  . Years of education: Not on file  . Highest education level: Not on file  Occupational History  . Not on file  Tobacco Use  . Smoking status: Never Smoker  . Smokeless tobacco: Never Used  Vaping Use  . Vaping Use: Never used  Substance and Sexual Activity  . Alcohol use: No  . Drug use: No  . Sexual activity: Yes    Birth control/protection: None  Other Topics Concern  . Not on file  Social History Narrative  . Not on file   Social Determinants of Health   Financial Resource Strain:   . Difficulty of Paying Living Expenses: Not on file  Food Insecurity:   . Worried About Programme researcher, broadcasting/film/video in the Last Year: Not on file  . Ran Out of Food in the Last Year: Not on file  Transportation Needs:   . Lack of Transportation (Medical): Not on file  . Lack of Transportation (Non-Medical): Not on file  Physical Activity:   . Days of Exercise per Week: Not on file  . Minutes of Exercise per Session: Not on file  Stress:   . Feeling of Stress : Not on file  Social Connections:   . Frequency of Communication with Friends and Family: Not on file  . Frequency of Social Gatherings with Friends and Family: Not on file  .  Attends Religious Services: Not on file  . Active Member of Clubs or Organizations: Not on file  . Attends Banker Meetings: Not on file  . Marital Status: Not on file  Intimate Partner Violence:   . Fear of Current or Ex-Partner: Not on file  . Emotionally Abused: Not on file  . Physically Abused: Not on file  . Sexually Abused: Not on file    Outpatient Medications Prior to Visit  Medication Sig Dispense Refill  . acetaminophen (TYLENOL) 500 MG tablet Take 2 tablets (1,000 mg total) by mouth every 6 (six) hours as needed. 30 tablet 0  . doxycycline (VIBRAMYCIN) 100 MG capsule Take 1 capsule (100 mg total) by mouth 2 (two) times daily. 20 capsule 0  . levothyroxine (SYNTHROID) 75 MCG tablet Take 75 mcg by mouth daily.    .  Prenatal Vit-Fe Fumarate-FA (PRENATAL MULTIVITAMIN) TABS tablet Take 1 tablet by mouth daily at 12 noon. 30 tablet 1   No facility-administered medications prior to visit.    Allergies  Allergen Reactions  . Gluten Meal Nausea And Vomiting    ROS Review of Systems  Constitutional: Negative for chills and fever.  Skin:       abscess to right upper back and chin. The upper back started first and then the chin.  She has been cleaning it with soap and water and silver She has tried tea tree oil.       Objective:    Physical Exam Neck:   Pulmonary:    Skin:    Capillary Refill: Capillary refill takes more than 3 seconds.  Neurological:     Mental Status: She is alert.     BP 134/88 (BP Location: Right Arm, Patient Position: Sitting, Cuff Size: Normal)   Pulse 80   Temp 97.6 F (36.4 C) (Temporal)   Ht 5\' 6"  (1.676 m)   Wt 254 lb (115.2 kg)   SpO2 100%   BMI 41.00 kg/m  Wt Readings from Last 3 Encounters:  11/21/19 254 lb (115.2 kg)  08/22/19 246 lb 0.2 oz (111.6 kg)  07/09/18 245 lb (111.1 kg)     Health Maintenance Due  Topic Date Due  . Hepatitis C Screening  Never done  . COVID-19 Vaccine (1) Never done  . PAP-Cervical Cytology Screening  Never done  . PAP SMEAR-Modifier  Never done  . INFLUENZA VACCINE  09/11/2019    There are no preventive care reminders to display for this patient.  Lab Results  Component Value Date   TSH 5.820 (H) 08/22/2019   Lab Results  Component Value Date   WBC 11.6 (H) 07/10/2018   HGB 11.4 (L) 07/10/2018   HCT 34.9 (L) 07/10/2018   MCV 84.9 07/10/2018   PLT 119 (L) 07/10/2018   Lab Results  Component Value Date   NA 138 08/22/2019   K 4.4 08/22/2019   CO2 29 12/01/2014   GLUCOSE 84 08/22/2019   BUN 12 08/22/2019   CREATININE 0.80 08/22/2019   BILITOT 0.2 08/22/2019   ALKPHOS 70 08/22/2019   AST 23 08/22/2019   ALT 20 12/01/2014   PROT 7.3 08/22/2019   ALBUMIN 4.6 08/22/2019   CALCIUM 9.8 08/22/2019    ANIONGAP 14 01/29/2014   GFR 95.35 12/01/2014   Lab Results  Component Value Date   CHOL 175 05/15/2011   Lab Results  Component Value Date   HDL 49.50 05/15/2011   Lab Results  Component Value Date   LDLCALC 117 (H) 05/15/2011  Lab Results  Component Value Date   TRIG 43.0 05/15/2011   Lab Results  Component Value Date   CHOLHDL 4 05/15/2011   Lab Results  Component Value Date   HGBA1C 5.6 08/22/2019   HGBA1C 5.6 08/22/2019   HGBA1C 5.6 (A) 08/22/2019   HGBA1C 5.6 08/22/2019      Assessment & Plan:   Problem List Items Addressed This Visit    None    Visit Diagnoses    Facial abscess    -  Primary Treated with Doxycyline on last dose today. Notable improvement however hard core palpable. Will continue to monitor along with referral to derm.     Relevant Orders   Ambulatory referral to Dermatology   Eczema, unspecified type       Relevant Orders   Ambulatory referral to Dermatology      No orders of the defined types were placed in this encounter.   Follow-up: No follow-ups on file.    Barbette Merino, NP

## 2019-11-28 ENCOUNTER — Ambulatory Visit (INDEPENDENT_AMBULATORY_CARE_PROVIDER_SITE_OTHER): Payer: 59 | Admitting: Dermatology

## 2019-11-28 ENCOUNTER — Other Ambulatory Visit: Payer: Self-pay | Admitting: Dermatology

## 2019-11-28 ENCOUNTER — Other Ambulatory Visit: Payer: Self-pay

## 2019-11-28 DIAGNOSIS — L309 Dermatitis, unspecified: Secondary | ICD-10-CM

## 2019-11-28 DIAGNOSIS — L906 Striae atrophicae: Secondary | ICD-10-CM

## 2019-11-28 DIAGNOSIS — L720 Epidermal cyst: Secondary | ICD-10-CM

## 2019-11-28 DIAGNOSIS — L918 Other hypertrophic disorders of the skin: Secondary | ICD-10-CM | POA: Diagnosis not present

## 2019-11-28 MED ORDER — EUCRISA 2 % EX OINT: 1.0000 "application " | TOPICAL_OINTMENT | CUTANEOUS | 2 refills | Status: DC

## 2019-11-28 MED ORDER — CLINDAMYCIN PHOSPHATE 1 % EX LOTN: TOPICAL_LOTION | Freq: Every day | CUTANEOUS | 0 refills | Status: DC

## 2019-11-28 MED ORDER — CLOBETASOL PROPIONATE 0.05 % EX CREA: 1.0000 "application " | TOPICAL_CREAM | CUTANEOUS | 1 refills | Status: DC

## 2019-11-28 MED ORDER — DOXYCYCLINE HYCLATE 150 MG PO TABS
1.0000 | ORAL_TABLET | Freq: Every day | ORAL | 2 refills | Status: DC
Start: 2019-11-28 — End: 2020-06-19

## 2019-11-28 NOTE — Progress Notes (Signed)
   New Patient Visit  Subjective  Christina Costa is a 29 y.o. female who presents for the following: check spots (chin, R axilla, the one on chin drained) and Eczema (arms, chest, abdomen, 9m not using anything but otc lotion).  Pt has h/o eczema  New patient referral from Thad Ranger NP.  The following portions of the chart were reviewed this encounter and updated as appropriate:      Review of Systems:  No other skin or systemic complaints except as noted in HPI or Assessment and Plan.  Objective  Well appearing patient in no apparent distress; mood and affect are within normal limits.  A focused examination was performed including face, R axilla, arms, chest, abdomen. Relevant physical exam findings are noted in the Assessment and Plan.  Objective  chest, breast, abdomen, antecubitum, wrist, fingers webspaces: Pink scaly patches chest, breast, abdomen, antecubitum, wrist (with lichenification), fingers, webspaces  Objective    right inferior chin: 1.5cm firm sub q nodule with erythema  Right lower axilla: 1.0cm firm sub q nodule  Objective  bil axilla, abdomen: striae   Assessment & Plan    Acrochordons (Skin Tags) - Fleshy, skin-colored pedunculated papules - Benign appearing.  - Observe. - If desired, they can be removed with an in office procedure that is not covered by insurance. - Please call the clinic if you notice any new or changing lesions.  Eczema, unspecified type chest, breast, abdomen, antecubitum, wrist, fingers webspaces  Start Eucrisa ointment bid  Start clobetasol cr qd/bid aa eczema up to 2 weeks, avoid f/g/a  Start mild soap and moisturizer qd/bid  Consider True Patch Test 36 if not improving Consider Dupixent if patch test negative  Topical steroids (such as triamcinolone, fluocinolone, fluocinonide, mometasone, clobetasol, halobetasol, betamethasone, hydrocortisone) can cause thinning and lightening of the skin if they are used for  too long in the same area. Your physician has selected the right strength medicine for your problem and area affected on the body. Please use your medication only as directed by your physician to prevent side effects.    Crisaborole (EUCRISA) 2 % OINT - chest, breast, abdomen, antecubitum, wrist, fingers webspaces  clobetasol cream (TEMOVATE) 0.05 % - chest, breast, abdomen, antecubitum, wrist, fingers webspaces  Epidermal cyst (2) right inferior chin; Right lower axilla  Re start Doxycycline 150mg  1 po qd with food and drink Start Clindamycin lotion qd aa Start Neutrogena otc BPO qd  Discussed adding spironolactone if not improving  Doxycycline should be taken with food to prevent nausea. Do not lay down for 30 minutes after taking. Be cautious with sun exposure and use good sun protection while on this medication. Pregnant women should not take this medication.    Doxycycline Hyclate 150 MG TABS - Right lower axilla, right inferior chin  clindamycin (CLEOCIN T) 1 % lotion - Right lower axilla, right inferior chin  Striae bil axilla, abdomen  Benign, observe  Return in about 1 month (around 12/29/2019) for eczema, cyst.  I, Sonya Hupman, RMA, am acting as scribe for 12/31/2019, MD . Documentation: I have reviewed the above documentation for accuracy and completeness, and I agree with the above.  Willeen Niece MD

## 2019-11-28 NOTE — Patient Instructions (Signed)
Start clobetasol cr one to two times a day up to 2 weeks to affected area of eczema, avoid face/groin/axilla    Atopic Dermatitis  "Dermatitis" means inflammation of the skin.  "Atopic" dermatitis is a particular type of skin inflammation that is marked by dryness, associated itching, and a characteristic pattern of rash on the body.  The condition is fairly common and may occur in as many as 10% of children.  You will often hear it called "atopic eczema" or sometimes just "eczema".  The exact cause of atopic dermatitis is unknown.  In many patients, there is a family history of hay fever, asthma, or atopic dermatitis itself.  Rarely, atopic dermatitis in infants may be related to food sensitivity, such as sensitivity to milk, but this is often difficult to determine and manage.  In the majority of cases, however, no allergic triggers can be found.  Physical or emotional stressors (severe seasonal allergies, physical illness, etc.) can worsen atopic dermatitis.  Atopic dermatitis usually starts in infancy from the ages of 2 to 6 months.  The skin is dry and the rash is quite itchy, so infants may be restless and rub against the sheets or scratch (if able).  The rash may involve the face or it may cover a large part of the body.  As the child gets older, the rash may become more localized.  In early childhood, the rash is commonly on the legs, feet, hands and arms.  As a child becomes older, the rash may be limited to the bend of the elbows, knees, on the back of the hands, feet, and on the neck and face.  When the rash becomes more established, the dry itchy skin may become thickened, leathery and sometimes darker in coloration.  The more the person scratches, the worse the rash is and the thicker the skin gets.  Many children with atopic dermatitis outgrow the condition before school age, while others continue to have problems into adolescence and adulthood.  Many things may affect the severity of the  condition.  All patients have sensitive and dry skin.  Many will find that during the winter months when the humidity is very low, the dryness and itchiness will be worse.  On the other hand, some people are easily irritated by sweat and will find that they have more problems during the summer months.  Most patients note an increase in itching at times when there are sudden changes in temperature.  Other irritants easily affect the skin of a patient with atopic dermatitis.  Use of harsh soaps or detergents and exposure to wool are common problems.  Sometimes atopic dermatitis may become infected by bacteria, yeast or viruses.  This is called "secondary infection".  Bacterial secondary infection is the most common and is often a result of scratching.  The rash gets very red with pus-filled pimples and scabs.  If this occurs, your doctor will prescribe an antibiotic to control the infection.  A more serious complication can be caused by certain viruses.  The "cold sore" virus (herpes simplex) may cause a severe rash.  If this is suspected, immediately contact your doctor.   What can I expect from treatment? Unfortunately, there is no "magic" cure that will always eliminate atopic dermatitis.  The main objective in treating atopic dermatitis is to decrease the skin eruption and relieve the itching.  There are a number of different forms of the medications that are used for atopic dermatitis.  Primarily, topical medications will  be used.  Because the skin is excessively dry, moisturizers will be recommended that will effectively decrease the dryness.  Daily bathing is a useful way to get water into the skin but bathing should be brief (no more than 10 minutes unless otherwise indicated by your physician).  Effective moisturizers (Cetaphil cream or lotion, CeraVe cream or lotion [[employer], [employer], and Wal[employer]], Aquaphor, and plain Vaseline) can be used immediately after the bath or shower to trap moisture within the  skin.  It is best to "pat dry" after a bathing and then place your moisturizer (cream or lotion) on your skin.  Cortisone (steroid) is a medicated ointment or cream (eg. triamcinolone, hydrocortisone, desonide, betamethasone, clobetasol) that may also be suggested.  It is very helpful in decreasing the itching and controlling the inflammation.  Your doctor will prescribe a cortisone treatment that is most appropriate for the severity and location of the dermatitis that is to be treated.   Once the affected area clears up, it is best to discontinue the use of the cortisone preparation due to possibility of atrophy (skin thinning), but continue the regular use of moisturizers to try to prevent new areas of dermatitis from occurring.  Of course, if itching or a new rash begins, the cortisone preparation may have to be started again.  Anti-inflammatory creams and ointments which are not steroids such as Protopic and Elidel may also be prescribed.  Certain internal medicines called antihistamines (eg. Atarax, Benadryl, hydroxyzine) may help control itching.  They primarily help with the itching by introducing some drowsiness and allowing you to sleep at night.  Some oral antibiotics are often useful as well for controlling the secondary infection and enable infected dermatitis to be controlled.  Other important forms of treatment: 1. Avoid contact with substances you know to cause itching.  These may include soaps, detergents, certain perfumes, dust, grass, weeds, wools, and other types of scratchy clothing. 2. You may bathe daily.  Use no soap or the minimal amount necessary to get clean.  Always use moisturizer immediately after bathing (within 3 minutes is best).  Avoid very hot or very cold water.  Avoid bubble baths.  When drying with a towel, pat dry and do not rub. Use a mild, unscented soap (Dove, CeraVe Cleanser, Lever 2000, or Cetaphil). 3. Try to keep the temperature and humidity in the home fairly  constant.  Use a bedroom air conditioner in the summer and a humidifier in the winter.  It is very important that the humidifier be cleaned frequently and thoroughly since mold may grow and cause allergies. 4. Try to avoid scratching.  Atopic dermatitis is often called "the itch that rashes" and it is known that scratching plays a significant role in making atopic dermatitis worse.  Keeping the nails short and well-filed is helpful. 5. Use a fragrance-free, sensitive skin laundry detergent (eg. All Free & Clear).  Run clothes through a second rinse cycle to remove any residual detergents and chemicals.  Bed linens and towels should be washed in hot water to kill dust mites, which are common allergen in atopic patients. 6. In the bedroom, minimize rugs and curtains or other loose fabrics that collect dust.  The National Eczema Association (www.eczema-assn.org) is a wonderful organization that sends out a Dealer with useful information on these types of conditions. Please consider contacting them at the above website or by address: National Ecz[employer] for [employer] and Education, [address], [address], [place], [address]

## 2020-01-02 ENCOUNTER — Other Ambulatory Visit: Payer: Self-pay

## 2020-01-02 ENCOUNTER — Ambulatory Visit (INDEPENDENT_AMBULATORY_CARE_PROVIDER_SITE_OTHER): Payer: 59 | Admitting: Dermatology

## 2020-01-02 DIAGNOSIS — L720 Epidermal cyst: Secondary | ICD-10-CM

## 2020-01-02 DIAGNOSIS — L209 Atopic dermatitis, unspecified: Secondary | ICD-10-CM

## 2020-01-02 NOTE — Progress Notes (Signed)
   Follow-Up Visit   Subjective  Christina Costa is a 29 y.o. female who presents for the following: Eczema (chest, breast, abdomen, antecubitum, wrist, fingers, webspaces 27m f/u had improved but recent flare,  Eucrisa oint qd, clobetasol cr qd) and Cyst (R lower axilla, R infer chin, 26m f/u, improved, didnt take Doxycycline, clindamycin lotion qd). She has h/o eczema when a child.  She also has seasonal allergies, worse in spring.   The following portions of the chart were reviewed this encounter and updated as appropriate:      Review of Systems:  No other skin or systemic complaints except as noted in HPI or Assessment and Plan.  Objective  Well appearing patient in no apparent distress; mood and affect are within normal limits.  A focused examination was performed including face, arms, chest. Relevant physical exam findings are noted in the Assessment and Plan.  Objective  Right lower axilla, R inferior chin: Slight SQ induration R lower axilla, R inferior chin (with mild erythema)  Objective  arms, wrist, anticubitum, chest, abdomen, fingers, webspaces: Pink scaly patches bil wrist, antecubitum, hands, fingers, large pink scaly patches axilla, chest breast, abdomen   Assessment & Plan  Epidermal cyst Right lower axilla, R inferior chin  improving Cont Clindamycin lotion qd If start to flare can start the Doxycycline 150mg  1 po qd with food and drink (prescribed last visit but pt never started.)  Discussed adding in Spironolactone 100 mg PO qd if worsens    Doxycycline Hyclate 150 MG TABS - Right lower axilla, R inferior chin  clindamycin (CLEOCIN T) 1 % lotion - Right lower axilla, R inferior chin  Atopic dermatitis, unspecified type arms, wrist, anticubitum, chest, abdomen, fingers, webspaces  Flared  Atopic dermatitis (eczema) is a chronic, relapsing, pruritic condition that can significantly affect quality of life. It is often associated with allergic rhinitis  and/or asthma and can require treatment with topical medications, phototherapy, or in severe cases a biologic medication called Dupixent.    Discussed Dupixent and s/e, pt prefers to treat with topicals at this time. Dupilumab (Dupixent) is a treatment given by injection for adults with moderate-to-severe atopic dermatitis. Goal is control of skin condition, not cure. It is given as 2 injections at the first dose followed by 1 injection ever 2 weeks thereafter.  Potential side effects include allergic reaction, herpes infections, injection site reactions and conjunctivitis (inflammation of the eyes).  The use of Dupixent requires long term medication management, including periodic office visits.   Increase Eucrisa oint to bid Cont Clobetasol cream qd to more severe/thicker areas up to 2 weeks at a time, avoid f/g/a  Cont mositurizer qd/bid, samples given  Return in about 2 months (around 03/03/2020) for Eczema.  I, 03/05/2020, RMA, am acting as scribe for Ardis Rowan, MD . Documentation: I have reviewed the above documentation for accuracy and completeness, and I agree with the above.  Willeen Niece MD

## 2020-01-02 NOTE — Patient Instructions (Addendum)
For Eczema  Increase Eucrisa ointment to twice a day Cont Clobetasol cream once a day to more server/thicker areas, avoid using on face, under arms and in groin, can use for 2 weeks at a time then stop, may restart after a week off.  For Cyst  Continue the Clindamycin lotion once daily If the cyst flare, start the Doxycycline 150mg  1 a day with food and drink

## 2020-02-22 ENCOUNTER — Ambulatory Visit: Payer: 59 | Admitting: Nurse Practitioner

## 2020-02-26 IMAGING — US US RENAL
1 series · 14 of 25 positions shown · non-contrast
Comparison: Prior CT from 01/29/2014.

CLINICAL DATA: Initial evaluation for septate uterus.

EXAM:
RENAL / URINARY TRACT ULTRASOUND COMPLETE

[Series 1: us renal · 14 of 40 slices shown]
[im 1/40]
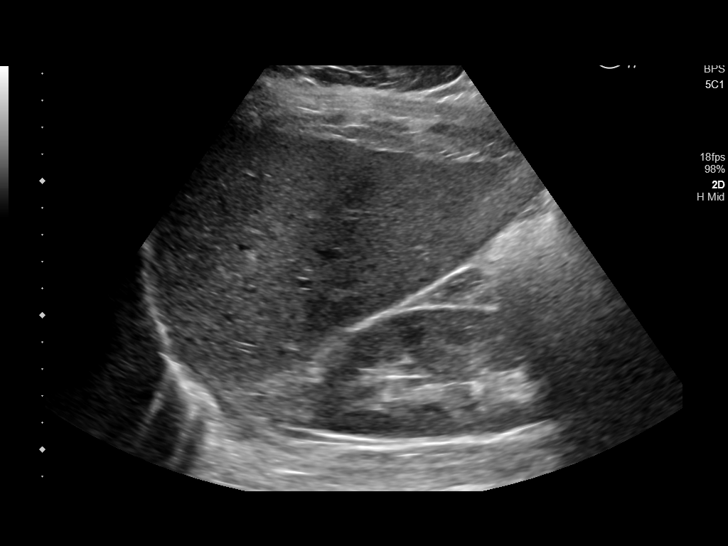
[im 4/40]
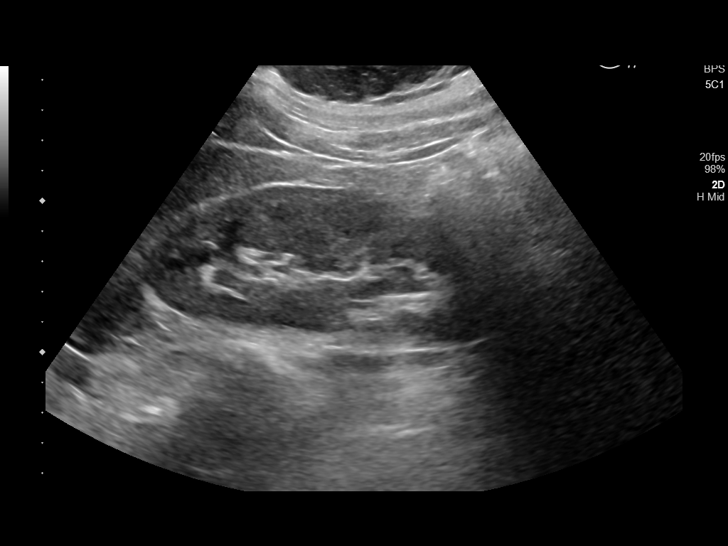
[im 7/40]
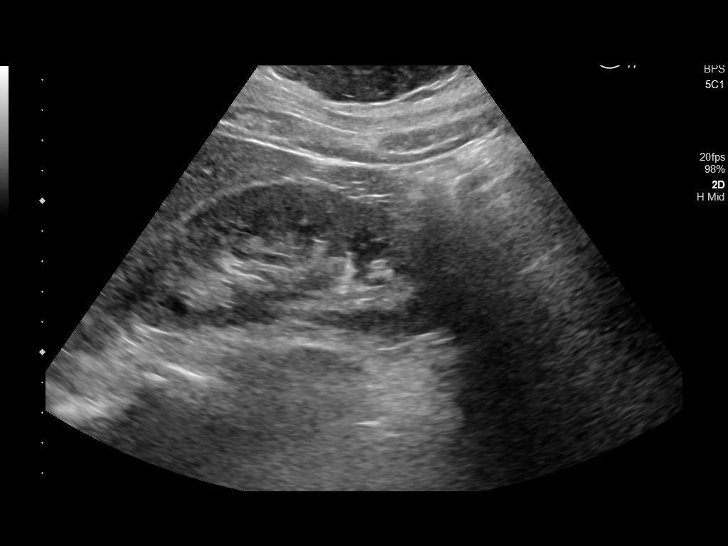
[im 10/40]
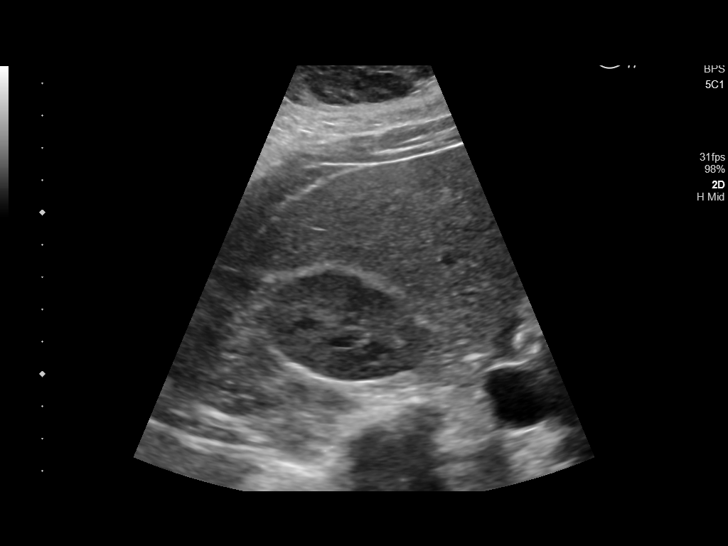
[im 14/40]
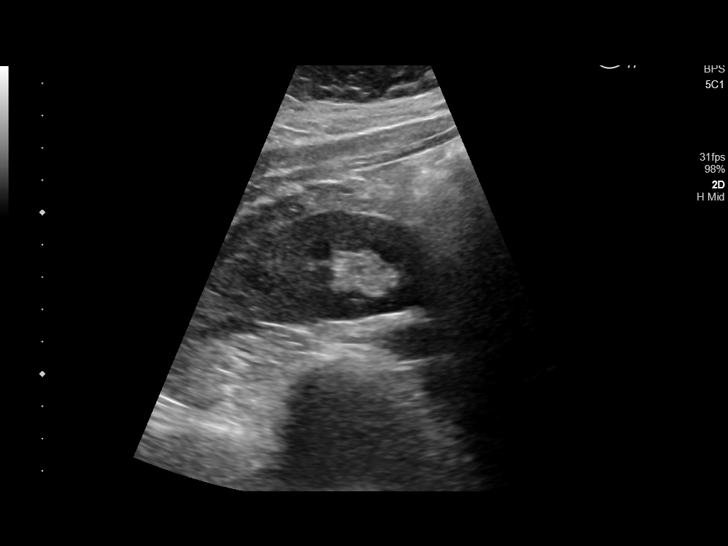
[im 15/40]
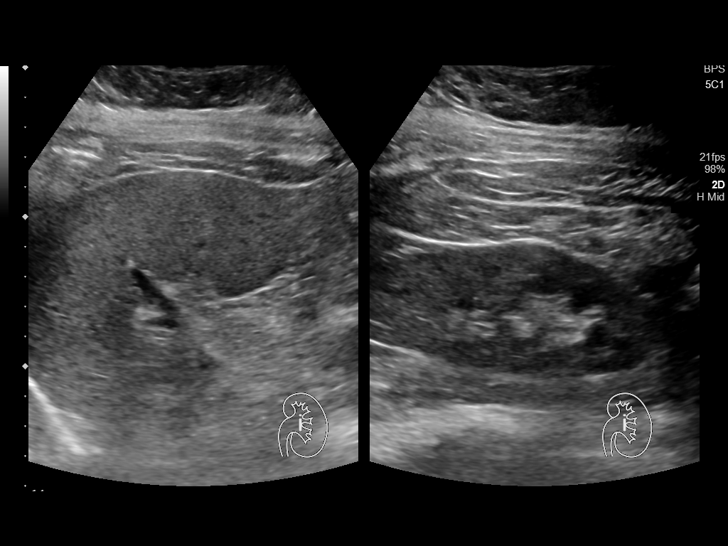
[im 18/40]
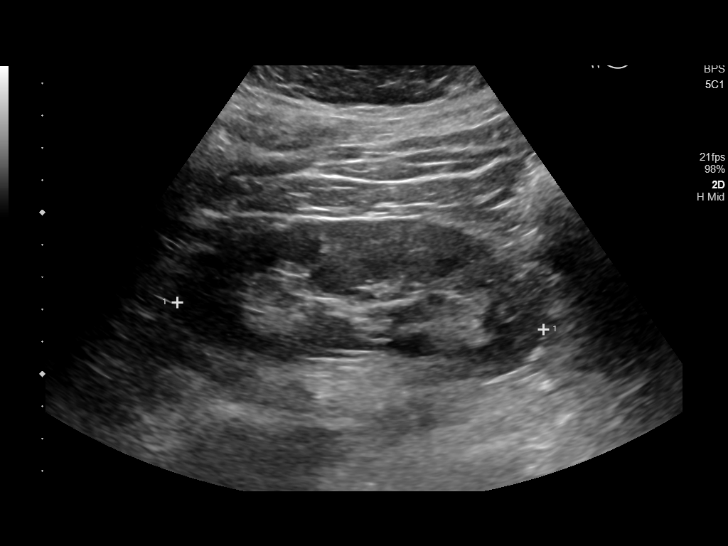
[im 22/40]
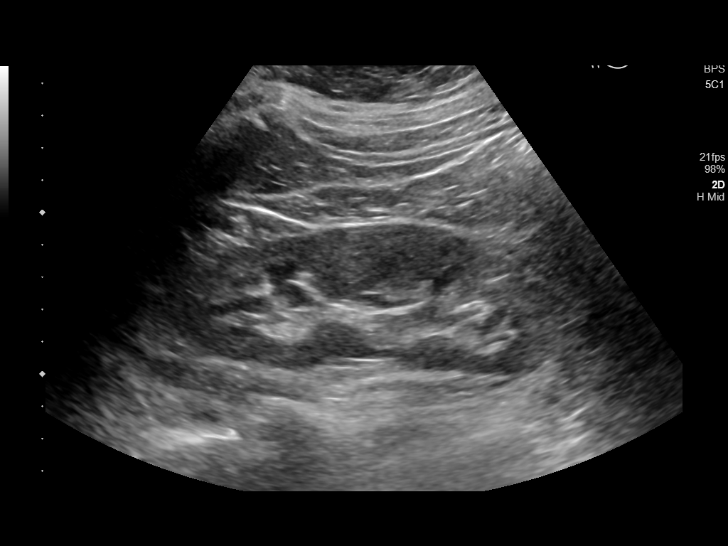
[im 25/40]
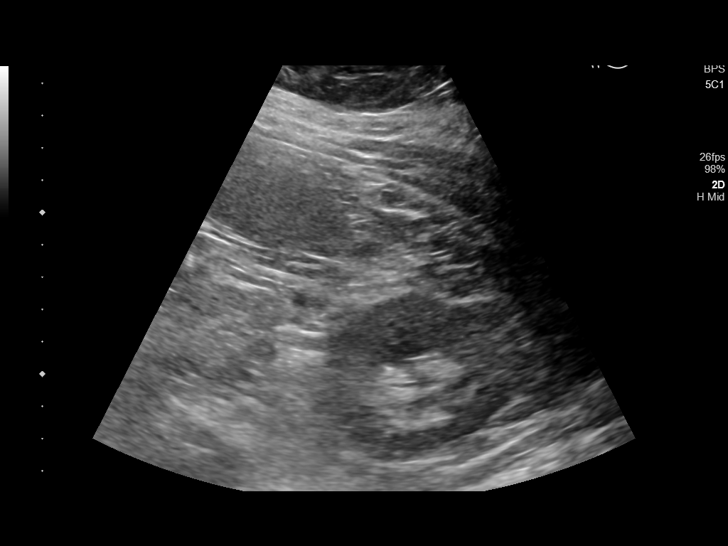
[im 27/40]
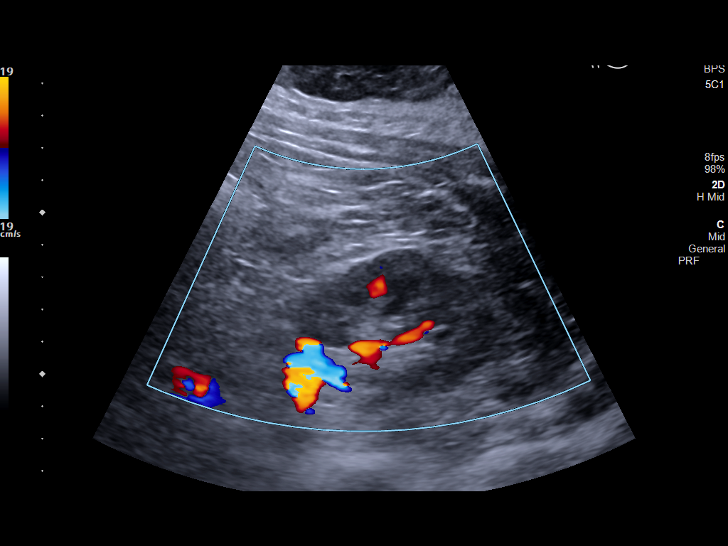
[im 30/40]
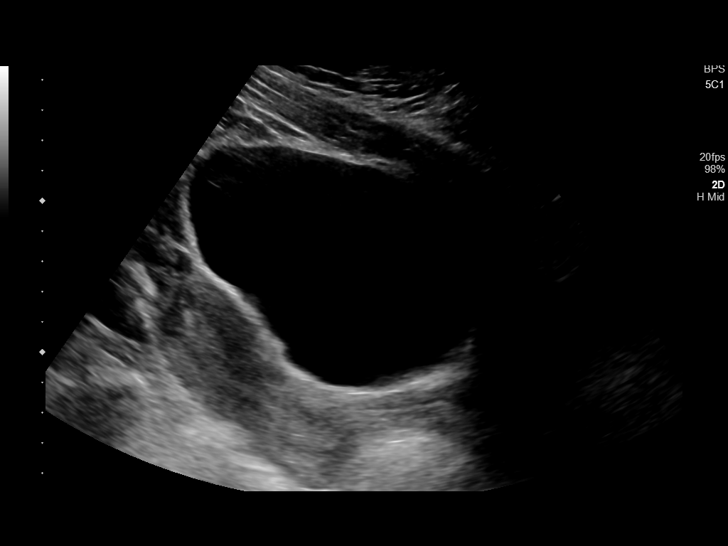
[im 33/40]
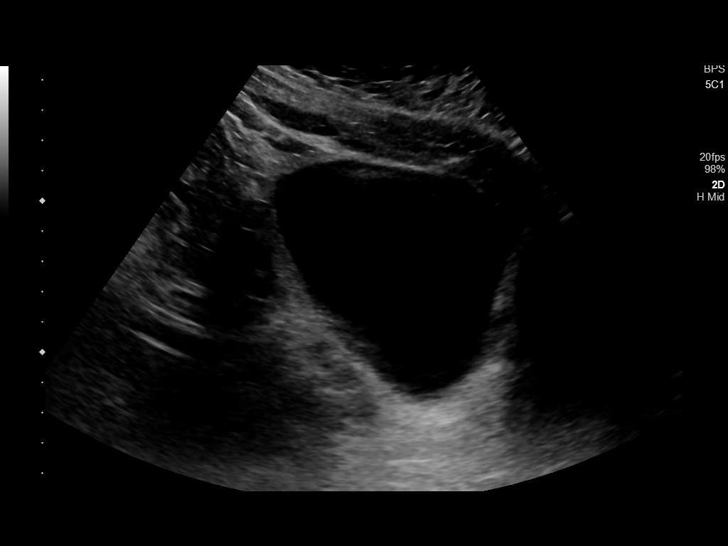
[im 36/40]
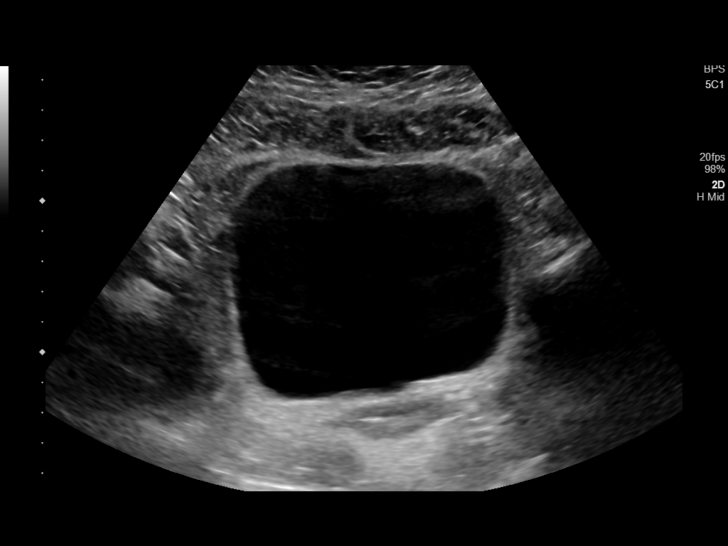
[im 40/40]
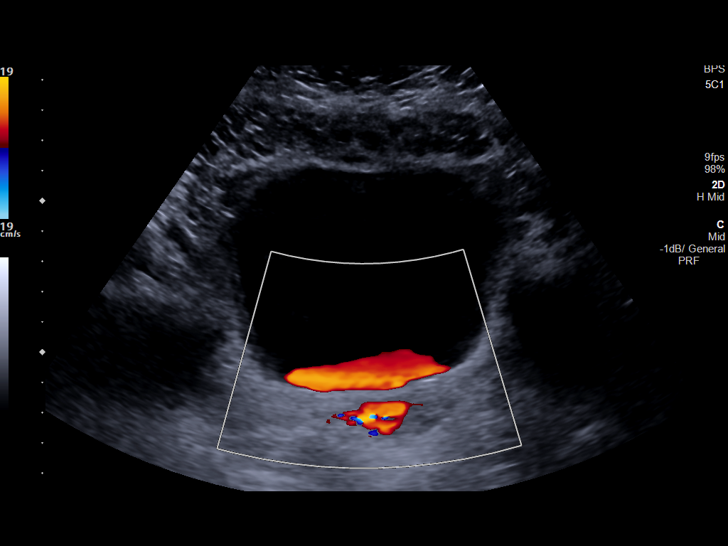

[14 of 25 positions shown; findings below may reference images not displayed]

FINDINGS: Right Kidney:

Length: 12.1 cm. Echogenicity within normal limits. No mass or
hydronephrosis visualized.

Left Kidney:

Length: 11.4 cm. Echogenicity within normal limits. No mass or
hydronephrosis visualized.

Bladder:

Appears normal for degree of bladder distention.
IMPRESSION: Normal renal ultrasound.

## 2020-03-27 ENCOUNTER — Ambulatory Visit: Payer: 59 | Admitting: Dermatology

## 2020-06-19 ENCOUNTER — Ambulatory Visit: Payer: 59 | Admitting: Nurse Practitioner

## 2020-06-19 ENCOUNTER — Encounter: Payer: Self-pay | Admitting: Nurse Practitioner

## 2020-06-19 ENCOUNTER — Other Ambulatory Visit: Payer: Self-pay

## 2020-06-19 VITALS — BP 133/84 | HR 83 | Temp 98.1°F | Ht 66.0 in | Wt 254.3 lb

## 2020-06-19 DIAGNOSIS — E010 Iodine-deficiency related diffuse (endemic) goiter: Secondary | ICD-10-CM | POA: Diagnosis not present

## 2020-06-19 DIAGNOSIS — J301 Allergic rhinitis due to pollen: Secondary | ICD-10-CM

## 2020-06-19 DIAGNOSIS — Z6841 Body Mass Index (BMI) 40.0 and over, adult: Secondary | ICD-10-CM

## 2020-06-19 DIAGNOSIS — E079 Disorder of thyroid, unspecified: Secondary | ICD-10-CM | POA: Diagnosis not present

## 2020-06-19 DIAGNOSIS — Z7689 Persons encountering health services in other specified circumstances: Secondary | ICD-10-CM
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
## 2020-06-19 NOTE — Patient Instructions (Signed)

## 2020-06-21 DIAGNOSIS — Z7689 Persons encountering health services in other specified circumstances: Secondary | ICD-10-CM | POA: Insufficient documentation

## 2020-06-21 DIAGNOSIS — J301 Allergic rhinitis due to pollen: Secondary | ICD-10-CM | POA: Insufficient documentation

## 2020-06-21 DIAGNOSIS — Z6841 Body Mass Index (BMI) 40.0 and over, adult: Secondary | ICD-10-CM | POA: Insufficient documentation

## 2020-06-25 ENCOUNTER — Other Ambulatory Visit: Payer: Self-pay | Admitting: Nurse Practitioner

## 2020-06-25 DIAGNOSIS — E010 Iodine-deficiency related diffuse (endemic) goiter: Secondary | ICD-10-CM

## 2020-06-25 DIAGNOSIS — Z0000 Encounter for general adult medical examination without abnormal findings: Secondary | ICD-10-CM

## 2020-06-25 DIAGNOSIS — Z6841 Body Mass Index (BMI) 40.0 and over, adult: Secondary | ICD-10-CM

## 2020-06-25 DIAGNOSIS — E079 Disorder of thyroid, unspecified: Secondary | ICD-10-CM

## 2020-06-26 ENCOUNTER — Other Ambulatory Visit: Payer: Self-pay

## 2020-06-26 ENCOUNTER — Other Ambulatory Visit: Payer: BC Managed Care – PPO

## 2020-06-26 DIAGNOSIS — E010 Iodine-deficiency related diffuse (endemic) goiter: Secondary | ICD-10-CM

## 2020-06-26 DIAGNOSIS — Z6841 Body Mass Index (BMI) 40.0 and over, adult: Secondary | ICD-10-CM

## 2020-06-26 DIAGNOSIS — E039 Hypothyroidism, unspecified: Secondary | ICD-10-CM

## 2020-06-26 DIAGNOSIS — Z0000 Encounter for general adult medical examination without abnormal findings: Secondary | ICD-10-CM

## 2020-06-26 DIAGNOSIS — E079 Disorder of thyroid, unspecified: Secondary | ICD-10-CM

## 2020-06-27 ENCOUNTER — Other Ambulatory Visit (HOSPITAL_COMMUNITY): Payer: Self-pay

## 2020-06-27 ENCOUNTER — Other Ambulatory Visit: Payer: Self-pay | Admitting: Nurse Practitioner

## 2020-06-27 ENCOUNTER — Telehealth: Payer: Self-pay | Admitting: Nurse Practitioner

## 2020-06-27 DIAGNOSIS — E039 Hypothyroidism, unspecified: Secondary | ICD-10-CM

## 2020-06-27 LAB — CBC
Hematocrit: 41.1 % (ref 34.0–46.6)
Hemoglobin: 13.6 g/dL (ref 11.1–15.9)
MCH: 27.6 pg (ref 26.6–33.0)
MCHC: 33.1 g/dL (ref 31.5–35.7)
MCV: 84 fL (ref 79–97)
Platelets: 244 10*3/uL (ref 150–450)
RBC: 4.92 x10E6/uL (ref 3.77–5.28)
RDW: 13.1 % (ref 11.7–15.4)
WBC: 6.4 10*3/uL (ref 3.4–10.8)

## 2020-06-27 LAB — LIPID PANEL
Chol/HDL Ratio: 4.3 ratio (ref 0.0–4.4)
Cholesterol, Total: 206 mg/dL — ABNORMAL HIGH (ref 100–199)
HDL: 48 mg/dL (ref >39)
LDL Chol Calc (NIH): 142 mg/dL — ABNORMAL HIGH (ref 0–99)
Triglycerides: 87 mg/dL (ref 0–149)
VLDL Cholesterol Cal: 16 mg/dL (ref 5–40)

## 2020-06-27 LAB — VITAMIN D 25 HYDROXY (VIT D DEFICIENCY, FRACTURES): Vit D, 25-Hydroxy: 52.1 ng/mL (ref 30.0–100.0)

## 2020-06-27 LAB — HEMOGLOBIN A1C
Est. average glucose Bld gHb Est-mCnc: 117 mg/dL
Hgb A1c MFr Bld: 5.7 % — ABNORMAL HIGH (ref 4.8–5.6)

## 2020-06-27 LAB — FERRITIN: Ferritin: 66 ng/mL (ref 15–150)

## 2020-06-27 LAB — TSH: TSH: 7.94 u[IU]/mL — ABNORMAL HIGH (ref 0.450–4.500)

## 2020-06-27 LAB — T3: T3, Total: 115 ng/dL (ref 71–180)

## 2020-06-27 LAB — B12 AND FOLATE PANEL
Folate: >20 ng/mL (ref >3.0)
Vitamin B-12: 860 pg/mL (ref 232–1245)

## 2020-06-27 LAB — T4, FREE: Free T4: 1.1 ng/dL (ref 0.82–1.77)

## 2020-06-27 MED ORDER — LEVOTHYROXINE SODIUM 88 MCG PO TABS: 88.0000 ug | ORAL_TABLET | Freq: Every day | ORAL | 0 refills | Status: DC

## 2020-06-27 MED ORDER — LEVOTHYROXINE SODIUM 88 MCG PO TABS
88.0000 ug | ORAL_TABLET | Freq: Every day | ORAL | 0 refills | Status: DC
  Filled 2020-06-27 – 2020-06-29 (×2): qty 90, 90d supply, fill #0

## 2020-06-27 NOTE — Addendum Note (Signed)
Addended by: Thad Ranger on: 06/27/2020 03:34 PM   Modules accepted: Orders

## 2020-06-27 NOTE — Progress Notes (Signed)
Hi. Please let the patient know that Labs indicate that thyroid hypoactive with TSH at 7.940. increased levothyroxine dose to and sent new prescription to [facility] on [address] in [place]. She should keep appointment for thyroid ultrasound as scheduled and follow up with me as scheduled. Will recheck thyroid panel at that time. Thanks so much.

## 2020-06-27 NOTE — Telephone Encounter (Signed)
Patient would like her medications sent to [facility] on [address] for all medications and future medication refills as well. Synthroid needs to be sent.Thanks

## 2020-06-27 NOTE — Telephone Encounter (Signed)
Noted. Pharmacy preference has been updated. Synthroid refill has been sent as well.  Faythe Dingwall, CMA

## 2020-06-27 NOTE — Progress Notes (Signed)
Labs indicate that thyroid hypoactive with TSH at 7.940. increased levothyroxine dose to and sent new prescription to [facility] on [address] in [place]. She should keep appointment for thyroid ultrasound as scheduled and follow up with me as scheduled. Will recheck thyroid panel at that time.

## 2020-06-29 ENCOUNTER — Other Ambulatory Visit (HOSPITAL_COMMUNITY): Payer: Self-pay

## 2020-07-02 ENCOUNTER — Ambulatory Visit
Admission: RE | Admit: 2020-07-02 | Discharge: 2020-07-02 | Disposition: A | Discharge status: Home / Self Care | Payer: BC Managed Care – PPO | Source: Ambulatory Visit | Attending: Nurse Practitioner | Admitting: Nurse Practitioner

## 2020-07-11 ENCOUNTER — Telehealth: Payer: Self-pay | Admitting: Nurse Practitioner

## 2020-07-11 NOTE — Telephone Encounter (Signed)
Called pt she is advised of her ultrasound results

## 2020-07-11 NOTE — Telephone Encounter (Signed)
Hey. Can you please let the patient know that her thyroid ultrasound was normal. Thanks.

## 2020-07-11 NOTE — Telephone Encounter (Signed)
Patient called office requesting [place] results for Thyroid. These results have not been reviewed by provider. Please review results and advise. AS, CMA

## 2020-07-11 NOTE — Progress Notes (Signed)
Normal thyroid ultrasound. Patient to be notified.

## 2020-09-18 ENCOUNTER — Other Ambulatory Visit: Payer: Self-pay

## 2020-09-18 ENCOUNTER — Encounter: Payer: Self-pay | Admitting: Nurse Practitioner

## 2020-09-18 ENCOUNTER — Ambulatory Visit: Payer: BC Managed Care – PPO | Admitting: Nurse Practitioner

## 2020-09-18 VITALS — BP 121/82 | HR 78 | Temp 98.1°F | Ht 66.0 in | Wt 250.1 lb

## 2020-09-18 DIAGNOSIS — Z6841 Body Mass Index (BMI) 40.0 and over, adult: Secondary | ICD-10-CM

## 2020-09-18 DIAGNOSIS — R5383 Other fatigue: Secondary | ICD-10-CM | POA: Diagnosis not present

## 2020-09-18 DIAGNOSIS — E039 Hypothyroidism, unspecified: Secondary | ICD-10-CM | POA: Diagnosis not present

## 2020-09-18 NOTE — Progress Notes (Signed)
Established Patient Office Visit  Subjective:  Patient ID: Christina Costa, female    DOB: [birth year]  Age: 30 y.o. MRN: [id number]  CC:  Chief Complaint  Patient presents with   Follow-up    HPI Christina Costa presents for routine follow-up visit.  She states that she is having severe fatigue.  She states she is actually feeling worse since levothyroxine was increased to 88 mcg daily.  It is time to recheck her thyroid panel.  We will get those labs during her visit today.  She did have an ultrasound of her thyroid done prior to this visit.  It was normal. She has no new concerns or complaints today.  She denies chest pain, chest pressure, or shortness of breath. He denies headaches or visual disturbances. He denies abdominal pain, nausea, vomiting, or changes in bowel or bladder habits.   Past Medical History:  Diagnosis Date   Headache    Hypothyroidism    hypothyroidisn    since age 25   Seasonal allergies     Past Surgical History:  Procedure Laterality Date   CESAREAN SECTION N/A 07/09/2018   Procedure: CESAREAN SECTION;  Surgeon: Ranae Pila, MD;  Location: [facility] LD ORS;  Service: Obstetrics;  Laterality: N/A;  Primary  edc 07/27/18 NKDA need RNFA   CESAREAN SECTION N/A    [employer]reesia 06/17/2020   DILATION AND EVACUATION N/A 09/16/2017   Procedure: DILATATION AND EVACUATION;  Surgeon: Ranae Pila, MD;  Location: [facility] ORS;  Service: Gynecology;  Laterality: N/A;   HAND SURGERY  01/2019   permanent retainers     upper and lower    WISDOM TOOTH EXTRACTION      Family History  Problem Relation Age of Onset   Allergy (severe) Mother    Hypothyroidism Mother    Arthritis Maternal Grandmother    Heart disease Maternal Grandfather     Social History   Socioeconomic History   Marital status: Married    Spouse name: Not on file   Number of children: Not on file   Years of education: Not on file   Highest education level: Not on file  Occupational  History   Not on file  Tobacco Use   Smoking status: Never   Smokeless tobacco: Never  Vaping Use   Vaping Use: Never used  Substance and Sexual Activity   Alcohol use: No   Drug use: No   Sexual activity: Yes    Birth control/protection: None  Other Topics Concern   Not on file  Social History Narrative   Not on file   Social Determinants of Health   Financial Resource Strain: Not on file  Food Insecurity: Not on file  Transportation Needs: Not on file  Physical Activity: Not on file  Stress: Not on file  Social Connections: Not on file  Intimate Partner Violence: Not on file    Outpatient Medications Prior to Visit  Medication Sig Dispense Refill   clobetasol cream (TEMOVATE) 0.05 % Apply 1 application topically as directed. Qd to bid aa eczema on body up to 2 weeks, then d/c, avoid face, groin, axilla 60 g 1   Crisaborole 2 % OINT APPLY TO AFFECTED AREAS OF ECZEMA ON FACE AND BODY AS DIRECTED ONCE TO TWICE DAILY AS NEEDED FOR FLARES 100 g 2   potassium iodide (SSKI) 1 GM/ML solution Take by mouth once.     Prenatal Vit-Fe Fumarate-FA (PRENATAL MULTIVITAMIN) TABS tablet Take 1 tablet by mouth daily at 12 noon.  30 tablet 1   levothyroxine (SYNTHROID) 88 MCG tablet Take 1 tablet (88 mcg total) by mouth daily. 90 tablet 0   No facility-administered medications prior to visit.    Allergies  Allergen Reactions   Wheat Bran    Gluten Meal Nausea And Vomiting    ROS Review of Systems  Constitutional:  Positive for fatigue. Negative for activity change, chills and fever.  HENT:  Negative for congestion, postnasal drip, sinus pressure and sinus pain.   Eyes: Negative.   Respiratory:  Negative for cough, chest tightness and wheezing.   Cardiovascular:  Negative for chest pain and palpitations.  Gastrointestinal:  Negative for constipation, diarrhea, nausea and vomiting.  Endocrine: Negative for cold intolerance, heat intolerance, polydipsia and polyuria.       Patient  did have a check of thyroid panel today.  Recent thyroid ultrasound is normal.  Genitourinary:  Negative for dysuria, frequency and urgency.  Musculoskeletal:  Negative for arthralgias, back pain and myalgias.  Skin:  Negative for rash.  Allergic/Immunologic: Positive for environmental allergies.  Neurological:  Positive for headaches. Negative for dizziness and weakness.       Intermittent migraine headaches   Hematological: Negative.   Psychiatric/Behavioral:  Negative for sleep disturbance. The patient is not nervous/anxious.      Objective:    Physical Exam Vitals and nursing note reviewed.  Constitutional:      Appearance: Normal appearance. She is well-developed. She is obese.  HENT:     Head: Normocephalic and atraumatic.     Nose: Nose normal.     Mouth/Throat:     Mouth: Mucous membranes are moist.     Pharynx: Oropharynx is clear.  Eyes:     Extraocular Movements: Extraocular movements intact.     Conjunctiva/sclera: Conjunctivae normal.     Pupils: Pupils are equal, round, and reactive to light.  Neck:     Thyroid: No thyroid mass, thyromegaly or thyroid tenderness.  Cardiovascular:     Rate and Rhythm: Normal rate and regular rhythm.     Pulses: Normal pulses.     Heart sounds: Normal heart sounds.  Pulmonary:     Effort: Pulmonary effort is normal.     Breath sounds: Normal breath sounds.  Abdominal:     Palpations: Abdomen is soft.  Musculoskeletal:        General: Normal range of motion.     Cervical back: Normal range of motion and neck supple.  Lymphadenopathy:     Cervical: No cervical adenopathy.  Skin:    General: Skin is warm and dry.     Capillary Refill: Capillary refill takes less than 2 seconds.  Neurological:     General: No focal deficit present.     Mental Status: She is alert and oriented to person, place, and time.  Psychiatric:        Mood and Affect: Mood normal.        Behavior: Behavior normal.        Thought Content: Thought  content normal.        Judgment: Judgment normal.   Today's Vitals   09/18/20 1607  BP: 121/82  Pulse: 78  Temp: 98.1 F (36.7 C)  SpO2: 98%  Weight: 250 lb 1.6 oz (113.4 kg)  Height: 5\' 6"  (1.676 m)   Body mass index is 40.37 kg/m.   Wt Readings from Last 3 Encounters:  09/18/20 250 lb 1.6 oz (113.4 kg)  06/19/20 254 lb 4.8 oz (115.3 kg)  11/21/19  254 lb (115.2 kg)     Health Maintenance Due  Topic Date Due   COVID-19 Vaccine (1) Never done   Hepatitis C Screening  Never done   PAP-Cervical Cytology Screening  Never done   PAP SMEAR-Modifier  Never done   INFLUENZA VACCINE  09/10/2020    There are no preventive care reminders to display for this patient.  Lab Results  Component Value Date   TSH 7.580 (H) 09/18/2020   Lab Results  Component Value Date   WBC WILL FOLLOW 09/18/2020   HGB WILL FOLLOW 09/18/2020   HCT WILL FOLLOW 09/18/2020   MCV WILL FOLLOW 09/18/2020   PLT WILL FOLLOW 09/18/2020   Lab Results  Component Value Date   NA 138 08/22/2019   K 4.4 08/22/2019   CO2 29 12/01/2014   GLUCOSE 84 08/22/2019   BUN 12 08/22/2019   CREATININE 0.80 08/22/2019   BILITOT 0.2 08/22/2019   ALKPHOS 70 08/22/2019   AST 23 08/22/2019   ALT 20 12/01/2014   PROT 7.3 08/22/2019   ALBUMIN 4.6 08/22/2019   CALCIUM 9.8 08/22/2019   ANIONGAP 14 01/29/2014   GFR 95.35 12/01/2014   Lab Results  Component Value Date   CHOL 206 (H) 06/26/2020   Lab Results  Component Value Date   HDL 48 06/26/2020   Lab Results  Component Value Date   LDLCALC 142 (H) 06/26/2020   Lab Results  Component Value Date   TRIG 87 06/26/2020   Lab Results  Component Value Date   CHOLHDL 4.3 06/26/2020   Lab Results  Component Value Date   HGBA1C 5.7 (H) 06/26/2020      Assessment & Plan:  1. Hypothyroidism, unspecified type Check of TSH and free T4 while patient is in the office today.  We will do trial Tirosint, dosing as indicated per labs, to see if this improves  thyroid panel and patient symptoms.  Patient is agreeable to this plan. - TSH - T4, free - Levothyroxine Sodium (TIROSINT) 75 MCG CAPS; Take 1 capsule (75 mcg total) by mouth daily before breakfast.  Dispense: 30 capsule; Refill: 2  2. Other fatigue Will try Tirosint, dosing as indicated per labs, to see if different levothyroxine formulation will improve symptoms.  Recheck of TSH and free T4 at next visit.  3. Body mass index (BMI) of 40.1-44.9 in adult [facility]) Patient advised to limit calorie intake to 1500 cal daily.  Encouraged her to incorporate exercise into her daily routine.  Problem List Items Addressed This Visit       Endocrine   Hypothyroidism - Primary   Relevant Medications   Levothyroxine Sodium (TIROSINT) 75 MCG CAPS   Other Relevant Orders   TSH (Completed)   T4, free (Completed)     Other   Body mass index (BMI) of 40.1-44.9 in adult [facility])   Other fatigue    Meds ordered this encounter  Medications   Levothyroxine Sodium (TIROSINT) 75 MCG CAPS    Sig: Take 1 capsule (75 mcg total) by mouth daily before breakfast.    Dispense:  30 capsule    Refill:  2    Please d/c current prescription for levothyroxine. Patient having no improvement from levothyroxine. Causing her to be very fatigued.    Order Specific Question:   Supervising Provider    Answer:   Nani Gasser D [[id number]   This note was dictated using Dragon Voice Recognition Software. Rapid proofreading was performed to expedite the delivery of the information. Despite  proofreading, phonetic errors will occur which are common with this voice recognition software. Please take this into consideration. If there are any concerns, please contact our office.    Follow-up: Return in about 3 months (around 12/19/2020) for thyroid - will need to draw tsh, Free T4, and t3 at time of visit. check iodine level.    Carlean Jews, NP

## 2020-09-19 ENCOUNTER — Encounter: Payer: Self-pay | Admitting: Nurse Practitioner

## 2020-09-19 DIAGNOSIS — E039 Hypothyroidism, unspecified: Secondary | ICD-10-CM | POA: Insufficient documentation

## 2020-09-19 LAB — SPECIMEN STATUS

## 2020-09-19 MED ORDER — LEVOTHYROXINE SODIUM 75 MCG PO CAPS
75.0000 ug | ORAL_CAPSULE | Freq: Every day | ORAL | 2 refills | Status: DC
Start: 2020-09-19 — End: 2020-10-08

## 2020-09-19 NOTE — Progress Notes (Signed)
Changed levothyroxine to Tirosint daily. Will recheck thyroid panel at next visit. New prescription sent to her pharmacy. A MyChart message was sent to the patient.

## 2020-09-19 NOTE — Patient Instructions (Addendum)
Calorie Counting for Weight Loss Calories are units of energy. Your body needs a certain number of calories from food to keep going throughout the day. When you eat or drink more calories than your body needs, your body stores the extra calories mostly as fat. When you eat or drink fewer calories than your body needs, your body burns fat to getthe energy it needs. Calorie counting means keeping track of how many calories you eat and drink each day. Calorie counting can be helpful if you need to lose weight. If you eat fewer calories than your body needs, you should lose weight. Ask yourhealth care provider what a healthy weight is for you. For calorie counting to work, you will need to eat the right number of calories each day to lose a healthy amount of weight per week. A dietitian can help you figure out how many calories you need in a day and will suggest ways to reach your calorie goal. A healthy amount of weight to lose each week is usually 1-2 lb (0.5-0.9 kg). This usually means that your daily calorie intake should be reduced by 500-750 calories. Eating 1,200-1,500 calories a day can help most women lose weight. Eating 1,500-1,800 calories a day can help most men lose weight. What do I need to know about calorie counting? Work with your health care provider or dietitian to determine how many calories you should get each day. To meet your daily calorie goal, you will need to: Find out how many calories are in each food that you would like to eat. Try to do this before you eat. Decide how much of the food you plan to eat. Keep a food log. Do this by writing down what you ate and how many calories it had. To successfully lose weight, it is important to balance calorie counting with ahealthy lifestyle that includes regular activity. Where do I find calorie information?  The number of calories in a food can be found on a Nutrition Facts label. If a food does not have a Nutrition Facts label, try to  look up the calories onlineor ask your dietitian for help. Remember that calories are listed per serving. If you choose to have more than one serving of a food, you will have to multiply the calories per serving by the number of servings you plan to eat. For example, the label on a package of bread might say that a serving size is 1 slice and that there are 90 calories in a serving. If you eat 1 slice, you will have eaten 90 calories. If you eat 2slices, you will have eaten 180 calories. How do I keep a food log? After each time that you eat, record the following in your food log as soon as possible: What you ate. Be sure to include toppings, sauces, and other extras on the food. How much you ate. This can be measured in cups, ounces, or number of items. How many calories were in each food and drink. The total number of calories in the food you ate. Keep your food log near you, such as in a pocket-sized notebook or on an app or website on your mobile phone. Some programs will calculate calories for you andshow you how many calories you have left to meet your daily goal. What are some portion-control tips? Know how many calories are in a serving. This will help you know how many servings you can have of a certain food. Use a measuring cup to   measure serving sizes. You could also try weighing out portions on a kitchen scale. With time, you will be able to estimate serving sizes for some foods. Take time to put servings of different foods on your favorite plates or in your favorite bowls and cups so you know what a serving looks like. Try not to eat straight from a food's packaging, such as from a bag or box. Eating straight from the package makes it hard to see how much you are eating and can lead to overeating. Put the amount you would like to eat in a cup or on a plate to make sure you are eating the right portion. Use smaller plates, glasses, and bowls for smaller portions and to prevent  overeating. Try not to multitask. For example, avoid watching TV or using your computer while eating. If it is time to eat, sit down at a table and enjoy your food. This will help you recognize when you are full. It will also help you be more mindful of what and how much you are eating. What are tips for following this plan? Reading food labels Check the calorie count compared with the serving size. The serving size may be smaller than what you are used to eating. Check the source of the calories. Try to choose foods that are high in protein, fiber, and vitamins, and low in saturated fat, trans fat, and sodium. Shopping Read nutrition labels while you shop. This will help you make healthy decisions about which foods to buy. Pay attention to nutrition labels for low-fat or fat-free foods. These foods sometimes have the same number of calories or more calories than the full-fat versions. They also often have added sugar, starch, or salt to make up for flavor that was removed with the fat. Make a grocery list of lower-calorie foods and stick to it. Cooking Try to cook your favorite foods in a healthier way. For example, try baking instead of frying. Use low-fat dairy products. Meal planning Use more fruits and vegetables. One-half of your plate should be fruits and vegetables. Include lean proteins, such as chicken, turkey, and fish. Lifestyle Each week, aim to do one of the following: 150 minutes of moderate exercise, such as walking. 75 minutes of vigorous exercise, such as running. General information Know how many calories are in the foods you eat most often. This will help you calculate calorie counts faster. Find a way of tracking calories that works for you. Get creative. Try different apps or programs if writing down calories does not work for you. What foods should I eat?  Eat nutritious foods. It is better to have a nutritious, high-calorie food, such as an avocado, than a food with  few nutrients, such as a bag of potato chips. Use your calories on foods and drinks that will fill you up and will not leave you hungry soon after eating. Examples of foods that fill you up are nuts and nut butters, vegetables, lean proteins, and high-fiber foods such as whole grains. High-fiber foods are foods with more than 5 g of fiber per serving. Pay attention to calories in drinks. Low-calorie drinks include water and unsweetened drinks. The items listed above may not be a complete list of foods and beverages you can eat. Contact a dietitian for more information. What foods should I limit? Limit foods or drinks that are not good sources of vitamins, minerals, or protein or that are high in unhealthy fats. These include: Candy. Other sweets. Sodas, specialty   coffee drinks, alcohol, and juice. The items listed above may not be a complete list of foods and beverages you should avoid. Contact a dietitian for more information. How do I count calories when eating out? Pay attention to portions. Often, portions are much larger when eating out. Try these tips to keep portions smaller: Consider sharing a meal instead of getting your own. If you get your own meal, eat only half of it. Before you start eating, ask for a container and put half of your meal into it. When available, consider ordering smaller portions from the menu instead of full portions. Pay attention to your food and drink choices. Knowing the way food is cooked and what is included with the meal can help you eat fewer calories. If calories are listed on the menu, choose the lower-calorie options. Choose dishes that include vegetables, fruits, whole grains, low-fat dairy products, and lean proteins. Choose items that are boiled, broiled, grilled, or steamed. Avoid items that are buttered, battered, fried, or served with cream sauce. Items labeled as crispy are usually fried, unless stated otherwise. Choose water, low-fat milk,  unsweetened iced tea, or other drinks without added sugar. If you want an alcoholic beverage, choose a lower-calorie option, such as a glass of wine or light beer. Ask for dressings, sauces, and syrups on the side. These are usually high in calories, so you should limit the amount you eat. If you want a salad, choose a garden salad and ask for grilled meats. Avoid extra toppings such as bacon, cheese, or fried items. Ask for the dressing on the side, or ask for olive oil and vinegar or lemon to use as dressing. Estimate how many servings of a food you are given. Knowing serving sizes will help you be aware of how much food you are eating at restaurants. Where to find more information Centers for Disease Control and Prevention: http://www.wolf.info/ U.[place]. Department of Agriculture: http://www.wilson-mendoza.org/ Summary Calorie counting means keeping track of how many calories you eat and drink each day. If you eat fewer calories than your body needs, you should lose weight. A healthy amount of weight to lose per week is usually 1-2 lb (0.5-0.9 kg). This usually means reducing your daily calorie intake by 500-750 calories. The number of calories in a food can be found on a Nutrition Facts label. If a food does not have a Nutrition Facts label, try to look up the calories online or ask your dietitian for help. Use smaller plates, glasses, and bowls for smaller portions and to prevent overeating. Use your calories on foods and drinks that will fill you up and not leave you hungry shortly after a meal. This information is not intended to replace advice given to you by your health care provider. Make sure you discuss any questions you have with your healthcare provider. Document Revised: 03/10/2019 Document Reviewed: 03/10/2019 Elsevier Patient Education  2022 [place] and Cholesterol Restricted Eating Plan Getting too much fat and cholesterol in your diet may cause health problems. Choosing the right foods helps keep  your fat and cholesterol at normal levels.This can keep you from getting certain diseases. Your doctor may recommend an eating plan that includes: Total fat: ______% or less of total calories a day. Saturated fat: ______% or less of total calories a day. Cholesterol: less than _________mg a day. Fiber: ______g a day. What are tips for following this plan? Meal planning At meals, divide your plate into four equal parts: Fill one-half of  your plate with vegetables and green salads. Fill one-fourth of your plate with whole grains. Fill one-fourth of your plate with low-fat (lean) protein foods. Eat fish that is high in omega-3 fats at least two times a week. This includes mackerel, tuna, sardines, and salmon. Eat foods that are high in fiber, such as whole grains, beans, apples, broccoli, carrots, peas, and barley. General tips  Work with your doctor to lose weight if you need to. Avoid: Foods with added sugar. Fried foods. Foods with partially hydrogenated oils. Limit alcohol intake to no more than 1 drink a day for nonpregnant women and 2 drinks a day for men. One drink equals 12 oz of beer, 5 oz of wine, or 1 oz of hard liquor.  Reading food labels Check food labels for: Trans fats. Partially hydrogenated oils. Saturated fat (g) in each serving. Cholesterol (mg) in each serving. Fiber (g) in each serving. Choose foods with healthy fats, such as: Monounsaturated fats. Polyunsaturated fats. Omega-3 fats. Choose grain products that have whole grains. Look for the word "whole" as the first word in the ingredient list. Cooking Cook foods using low-fat methods. These include baking, boiling, grilling, and broiling. Eat more home-cooked foods. Eat at restaurants and buffets less often. Avoid cooking using saturated fats, such as butter, cream, palm oil, palm kernel oil, and coconut oil. Recommended foods  Fruits All fresh, canned (in natural juice), or frozen  fruits. Vegetables Fresh or frozen vegetables (raw, steamed, roasted, or grilled). Green salads. Grains Whole grains, such as whole wheat or whole grain breads, crackers, cereals, and pasta. Unsweetened oatmeal, bulgur, barley, quinoa, or brown rice. Corn or whole wheat flour tortillas. Meats and other protein foods Ground beef (85% or leaner), grass-fed beef, or beef trimmed of fat. Skinless chicken or turkey. Ground chicken or turkey. Pork trimmed of fat. All fish and seafood. Egg whites. Dried beans, peas, or lentils. Unsalted nuts or seeds. Unsalted canned beans. Nut butters without added sugar or oil. Dairy Low-fat or nonfat dairy products, such as skim or 1% milk, 2% or reduced-fat cheeses, low-fat and fat-free ricotta or cottage cheese, or plain low-fat and nonfat yogurt. Fats and oils Tub margarine without trans fats. Light or reduced-fat mayonnaise and salad dressings. Avocado. Olive, canola, sesame, or safflower oils. The items listed above may not be a complete list of foods and beverages youcan eat. Contact a dietitian for more information. Foods to avoid Fruits Canned fruit in heavy syrup. Fruit in cream or butter sauce. Fried fruit. Vegetables Vegetables cooked in cheese, cream, or butter sauce. Fried vegetables. Grains White bread. White pasta. White rice. Cornbread. Bagels, pastries, and croissants. Crackers and snack foods that contain trans fat and hydrogenated oils. Meats and other protein foods Fatty cuts of meat. Ribs, chicken wings, bacon, sausage, bologna, salami, chitterlings, fatback, hot dogs, bratwurst, and packaged lunch meats. Liver and organ meats. Whole eggs and egg yolks. Chicken and turkey with skin. Fried meat. Dairy Whole or 2% milk, cream, half-and-half, and cream cheese. Whole milk cheeses. Whole-fat or sweetened yogurt. Full-fat cheeses. Nondairy creamers and whipped toppings. Processed cheese, cheese spreads, and cheese curds. Beverages Alcohol.  Sugar-sweetened drinks such as sodas, lemonade, and fruit drinks. Fats and oils Butter, stick margarine, lard, shortening, ghee, or bacon fat. Coconut, palm kernel, and palm oils. Sweets and desserts Corn syrup, sugars, honey, and molasses. Candy. Jam and jelly. Syrup. Sweetened cereals. Cookies, pies, cakes, donuts, muffins, and ice cream. The items listed above may not be a complete list   may not be a complete list of foods and beverages you should avoid. Contact a dietitian for more information. Summary Choosing the right foods helps keep your fat and cholesterol at normal levels. This can keep you from getting certain diseases. At meals, fill one-half of your plate with vegetables and green salads. Eat high-fiber foods, like whole grains, beans, apples, carrots, peas, and barley. Limit added sugar, saturated fats, alcohol, and fried foods. This information is not intended to replace advice given to you by your health care provider. Make sure you discuss any questions you have with your health care provider. Document Revised: 06/01/2019 Document Reviewed: 06/01/2019 Elsevier Patient Education  2022 [employer]  Fat and Cholesterol Restricted Eating Plan Getting too much fat and cholesterol in your diet may cause health problems. Choosing the right foods helps keep your fat and cholesterol at normal levels. This can keep you from getting certain diseases. Your doctor may recommend an eating plan that includes: Total fat: ______% or less of total calories a day. Saturated fat: ______% or less of total calories a day. Cholesterol: less than _________mg a day. Fiber: ______g a day. What are tips for following this plan? Meal planning At meals, divide your plate into four equal parts: Fill one-half of your plate with vegetables and green salads. Fill one-fourth of your plate with whole grains. Fill one-fourth of your plate with low-fat (lean) protein foods. Eat fish that is high in omega-3 fats at least two times a  week. This includes mackerel, tuna, sardines, and salmon. Eat foods that are high in fiber, such as whole grains, beans, apples, broccoli, carrots, peas, and barley. General tips  Work with your doctor to lose weight if you need to. Avoid: Foods with added sugar. Fried foods. Foods with partially hydrogenated oils. Limit alcohol intake to no more than 1 drink a day for nonpregnant women and 2 drinks a day for men. One drink equals 12 oz of beer, 5 oz of wine, or 1 oz of hard liquor. Reading food labels Check food labels for: Trans fats. Partially hydrogenated oils. Saturated fat (g) in each serving. Cholesterol (mg) in each serving. Fiber (g) in each serving. Choose foods with healthy fats, such as: Monounsaturated fats. Polyunsaturated fats. Omega-3 fats. Choose grain products that have whole grains. Look for the word "whole" as the first word in the ingredient list. Cooking Cook foods using low-fat methods. These include baking, boiling, grilling, and broiling. Eat more home-cooked foods. Eat at restaurants and buffets less often. Avoid cooking using saturated fats, such as butter, cream, palm oil, palm kernel oil, and coconut oil. Recommended foods Fruits All fresh, canned (in natural juice), or frozen fruits. Vegetables Fresh or frozen vegetables (raw, steamed, roasted, or grilled). Green salads. Grains Whole grains, such as whole wheat or whole grain breads, crackers, cereals, and pasta. Unsweetened oatmeal, bulgur, barley, quinoa, or brown rice. Corn or whole wheat flour tortillas. Meats and other protein foods Ground beef (85% or leaner), grass-fed beef, or beef trimmed of fat. Skinless chicken or turkey. Ground chicken or turkey. Pork trimmed of fat. All fish and seafood. Egg whites. Dried beans, peas, or lentils. Unsalted nuts or seeds. Unsalted canned beans. Nut butters without added sugar or oil. Dairy Low-fat or nonfat dairy products, such as skim or 1% milk, 2% or  reduced-fat cheeses, low-fat and fat-free ricotta or cottage cheese, or plain low-fat and nonfat yogurt. Fats and oils Tub margarine without trans fats. Light or reduced-fat mayonnaise and salad dressings.   The items listed above may not be a complete list of foods and beverages youcan eat. Contact a dietitian for more information. Foods to avoid Fruits Canned fruit in heavy syrup. Fruit in cream or butter sauce. Fried fruit. Vegetables Vegetables cooked in cheese, cream, or butter sauce. Fried vegetables. Grains White bread. White pasta. White rice. Cornbread. Bagels, pastries, and croissants. Crackers and snack foods that contain trans fat and hydrogenated oils. Meats and other protein foods Fatty cuts of meat. Ribs, chicken wings, bacon, sausage, bologna, salami, chitterlings, fatback, hot dogs, bratwurst, and packaged lunch meats. Liver and organ meats. Whole eggs and egg yolks. Chicken and [place] with skin. Fried meat. Dairy Whole or 2% milk, cream, half-and-half, and cream cheese. Whole milk cheeses. Whole-fat or sweetened yogurt. Full-fat cheeses. Nondairy creamers and whipped toppings. Processed cheese, cheese spreads, and cheese curds. Beverages Alcohol. Sugar-sweetened drinks such as sodas, lemonade, and fruit drinks. Fats and oils Butter, stick margarine, lard, shortening, ghee, or bacon fat. Coconut, palm kernel, and palm oils. Sweets and desserts Corn syrup, sugars, honey, and molasses. Candy. Jam and jelly. Syrup. Sweetened cereals. Cookies, pies, cakes, donuts, muffins, and ice cream. The items listed above may not be a complete list of foods and beverages youshould avoid. Contact a dietitian for more information. Summary Choosing the right foods helps keep your fat and cholesterol at normal levels. This can keep you from getting certain diseases. At meals, fill one-half of your plate with vegetables and green salads. Eat  high-fiber foods, like whole grains, beans, apples, carrots, peas, and barley. Limit added sugar, saturated fats, alcohol, and fried foods. This information is not intended to replace advice given to you by your health care provider. Make sure you discuss any questions you have with your healthcare provider. Document Revised: 06/01/2019 Document Reviewed: 06/01/2019 Elsevier Patient Education  2022 [employer].

## 2020-09-21 DIAGNOSIS — R5383 Other fatigue: Secondary | ICD-10-CM | POA: Insufficient documentation

## 2020-09-21 LAB — T4, FREE: Free T4: 1.21 ng/dL (ref 0.82–1.77)

## 2020-09-21 LAB — TSH: TSH: 7.58 u[IU]/mL — ABNORMAL HIGH (ref 0.450–4.500)

## 2020-09-21 LAB — SPECIMEN STATUS REPORT

## 2020-10-08 ENCOUNTER — Other Ambulatory Visit: Payer: Self-pay | Admitting: Nurse Practitioner

## 2020-10-08 ENCOUNTER — Other Ambulatory Visit (HOSPITAL_COMMUNITY): Payer: Self-pay

## 2020-10-08 DIAGNOSIS — E039 Hypothyroidism, unspecified: Secondary | ICD-10-CM

## 2020-10-08 MED ORDER — SYNTHROID 75 MCG PO TABS: 75.0000 ug | ORAL_TABLET | Freq: Every day | ORAL | 1 refills | Status: DC

## 2020-10-08 MED ORDER — SYNTHROID 75 MCG PO TABS
75.0000 ug | ORAL_TABLET | Freq: Every day | ORAL | 1 refills | Status: DC
Start: 2020-10-08 — End: 2020-10-08
  Filled 2020-10-08: qty 90, 90d supply, fill #0

## 2020-10-08 NOTE — Progress Notes (Signed)
Changed tirosint to Synthroid daily. Added note to pharmacy that name brand is medically necessary as generic levothyroxine not improving thyroid panel at all and is causing her to have increased fatigue. Sent new prescription to [facility] pharmacy.

## 2020-10-08 NOTE — Progress Notes (Signed)
Sent Synthroid to [facility]

## 2020-12-19 ENCOUNTER — Ambulatory Visit: Payer: BC Managed Care – PPO | Admitting: Nurse Practitioner

## 2021-01-27 ENCOUNTER — Telehealth: Payer: BC Managed Care – PPO | Admitting: Family

## 2021-01-27 DIAGNOSIS — J069 Acute upper respiratory infection, unspecified: Secondary | ICD-10-CM | POA: Diagnosis not present

## 2021-01-27 MED ORDER — FLUTICASONE PROPIONATE 50 MCG/ACT NA SUSP: 2.0000 | Freq: Every day | NASAL | 6 refills | Status: DC

## 2021-01-27 NOTE — Progress Notes (Signed)

## 2021-03-27 LAB — OB RESULTS CONSOLE HEPATITIS B SURFACE ANTIGEN: Hepatitis B Surface Ag: NEGATIVE

## 2021-03-27 LAB — OB RESULTS CONSOLE RUBELLA ANTIBODY, IGM: Rubella: IMMUNE

## 2021-03-27 LAB — OB RESULTS CONSOLE ABO/RH: RH Type: POSITIVE

## 2021-03-27 LAB — OB RESULTS CONSOLE HIV ANTIBODY (ROUTINE TESTING): HIV: NONREACTIVE

## 2021-03-27 LAB — OB RESULTS CONSOLE ANTIBODY SCREEN: Antibody Screen: NEGATIVE

## 2021-03-27 LAB — OB RESULTS CONSOLE RPR: RPR: NONREACTIVE

## 2021-05-09 LAB — OB RESULTS CONSOLE GBS: GBS: NEGATIVE

## 2021-05-21 ENCOUNTER — Encounter (HOSPITAL_COMMUNITY): Payer: Self-pay | Admitting: *Deleted

## 2021-05-21 ENCOUNTER — Telehealth (HOSPITAL_COMMUNITY): Payer: Self-pay | Admitting: *Deleted

## 2021-05-21 NOTE — Telephone Encounter (Signed)
Preadmission screen  

## 2021-05-22 ENCOUNTER — Encounter (HOSPITAL_COMMUNITY): Payer: Self-pay | Admitting: *Deleted

## 2021-05-24 ENCOUNTER — Other Ambulatory Visit: Payer: Self-pay | Admitting: Certified Nurse Midwife

## 2021-05-27 ENCOUNTER — Other Ambulatory Visit: Payer: Self-pay | Admitting: Certified Nurse Midwife

## 2021-05-27 ENCOUNTER — Telehealth (HOSPITAL_COMMUNITY): Payer: Self-pay | Admitting: *Deleted

## 2021-05-27 NOTE — Telephone Encounter (Signed)
Preadmission screen  

## 2021-05-28 ENCOUNTER — Inpatient Hospital Stay (HOSPITAL_COMMUNITY): Payer: BC Managed Care – PPO | Admitting: Anesthesiology

## 2021-05-28 ENCOUNTER — Inpatient Hospital Stay (HOSPITAL_COMMUNITY): Payer: BC Managed Care – PPO

## 2021-05-28 ENCOUNTER — Encounter (HOSPITAL_COMMUNITY): Payer: Self-pay | Admitting: Certified Nurse Midwife

## 2021-05-28 ENCOUNTER — Inpatient Hospital Stay (HOSPITAL_COMMUNITY)
Admission: AD | Admit: 2021-05-28 | Discharge: 2021-05-31 | DRG: 787 | Disposition: A | Discharge status: Home / Self Care | Payer: BC Managed Care – PPO | Attending: Obstetrics and Gynecology | Admitting: Obstetrics and Gynecology

## 2021-05-28 ENCOUNTER — Other Ambulatory Visit: Payer: Self-pay

## 2021-05-28 DIAGNOSIS — Z7982 Long term (current) use of aspirin: Secondary | ICD-10-CM | POA: Diagnosis not present

## 2021-05-28 DIAGNOSIS — O99214 Obesity complicating childbirth: Secondary | ICD-10-CM | POA: Diagnosis present

## 2021-05-28 DIAGNOSIS — O99119 Other diseases of the blood and blood-forming organs and certain disorders involving the immune mechanism complicating pregnancy, unspecified trimester: Secondary | ICD-10-CM | POA: Diagnosis present

## 2021-05-28 DIAGNOSIS — Z3A37 37 weeks gestation of pregnancy: Secondary | ICD-10-CM | POA: Diagnosis not present

## 2021-05-28 DIAGNOSIS — O34211 Maternal care for low transverse scar from previous cesarean delivery: Secondary | ICD-10-CM | POA: Diagnosis present

## 2021-05-28 DIAGNOSIS — O99284 Endocrine, nutritional and metabolic diseases complicating childbirth: Secondary | ICD-10-CM | POA: Diagnosis present

## 2021-05-28 DIAGNOSIS — O3403 Maternal care for unspecified congenital malformation of uterus, third trimester: Secondary | ICD-10-CM | POA: Diagnosis present

## 2021-05-28 DIAGNOSIS — O134 Gestational [pregnancy-induced] hypertension without significant proteinuria, complicating childbirth: Secondary | ICD-10-CM | POA: Diagnosis present

## 2021-05-28 DIAGNOSIS — Z98891 History of uterine scar from previous surgery: Secondary | ICD-10-CM

## 2021-05-28 DIAGNOSIS — Q5128 Other doubling of uterus, other specified: Secondary | ICD-10-CM

## 2021-05-28 DIAGNOSIS — Z3490 Encounter for supervision of normal pregnancy, unspecified, unspecified trimester: Secondary | ICD-10-CM | POA: Diagnosis present

## 2021-05-28 DIAGNOSIS — D6959 Other secondary thrombocytopenia: Secondary | ICD-10-CM | POA: Diagnosis present

## 2021-05-28 DIAGNOSIS — O139 Gestational [pregnancy-induced] hypertension without significant proteinuria, unspecified trimester: Secondary | ICD-10-CM | POA: Diagnosis present

## 2021-05-28 DIAGNOSIS — D696 Thrombocytopenia, unspecified: Secondary | ICD-10-CM | POA: Diagnosis present

## 2021-05-28 DIAGNOSIS — E039 Hypothyroidism, unspecified: Secondary | ICD-10-CM | POA: Diagnosis present

## 2021-05-28 DIAGNOSIS — O9912 Other diseases of the blood and blood-forming organs and certain disorders involving the immune mechanism complicating childbirth: Secondary | ICD-10-CM | POA: Diagnosis present

## 2021-05-28 LAB — CBC
HCT: 37.6 % (ref 36.0–46.0)
HCT: 40.1 % (ref 36.0–46.0)
Hemoglobin: 12.8 g/dL (ref 12.0–15.0)
Hemoglobin: 13.7 g/dL (ref 12.0–15.0)
MCH: 28.8 pg (ref 26.0–34.0)
MCH: 29 pg (ref 26.0–34.0)
MCHC: 34 g/dL (ref 30.0–36.0)
MCHC: 34.2 g/dL (ref 30.0–36.0)
MCV: 84.7 fL (ref 80.0–100.0)
MCV: 84.8 fL (ref 80.0–100.0)
Platelets: 141 10*3/uL — ABNORMAL LOW (ref 150–400)
Platelets: 143 10*3/uL — ABNORMAL LOW (ref 150–400)
RBC: 4.44 MIL/uL (ref 3.87–5.11)
RBC: 4.73 MIL/uL (ref 3.87–5.11)
RDW: 14.2 % (ref 11.5–15.5)
RDW: 14.3 % (ref 11.5–15.5)
WBC: 10.2 10*3/uL (ref 4.0–10.5)
WBC: 8.4 10*3/uL (ref 4.0–10.5)
nRBC: 0 % (ref 0.0–0.2)
nRBC: 0 % (ref 0.0–0.2)

## 2021-05-28 LAB — COMPREHENSIVE METABOLIC PANEL
ALT: 18 U/L (ref 0–44)
AST: 20 U/L (ref 15–41)
Albumin: 3.4 g/dL — ABNORMAL LOW (ref 3.5–5.0)
Alkaline Phosphatase: 96 U/L (ref 38–126)
Anion gap: 9 (ref 5–15)
BUN: 10 mg/dL (ref 6–20)
CO2: 20 mmol/L — ABNORMAL LOW (ref 22–32)
Calcium: 8.9 mg/dL (ref 8.9–10.3)
Chloride: 108 mmol/L (ref 98–111)
Creatinine, Ser: 0.53 mg/dL (ref 0.44–1.00)
GFR, Estimated: >60 mL/min (ref >60)
Glucose, Bld: 106 mg/dL — ABNORMAL HIGH (ref 70–99)
Potassium: 3.6 mmol/L (ref 3.5–5.1)
Sodium: 137 mmol/L (ref 135–145)
Total Bilirubin: 0.4 mg/dL (ref 0.3–1.2)
Total Protein: 6.4 g/dL — ABNORMAL LOW (ref 6.5–8.1)

## 2021-05-28 LAB — PROTEIN / CREATININE RATIO, URINE
Creatinine, Urine: 303.19 mg/dL
Protein Creatinine Ratio: 0.07 mg/mg{Cre} (ref 0.00–0.15)
Total Protein, Urine: 21 mg/dL

## 2021-05-28 LAB — TYPE AND SCREEN
ABO/RH(D): B POS
Antibody Screen: NEGATIVE

## 2021-05-28 LAB — RPR: RPR Ser Ql: NONREACTIVE

## 2021-05-28 MED ORDER — SOD CITRATE-CITRIC ACID 500-334 MG/5ML PO SOLN
30.0000 mL | ORAL | Status: DC | PRN
  Administered 2021-05-29: 30 mL via ORAL
  Filled 2021-05-28: qty 30

## 2021-05-28 MED ORDER — LACTATED RINGERS AMNIOINFUSION
INTRAVENOUS | Status: DC
Start: 2021-05-29 — End: 2021-05-29

## 2021-05-28 MED ORDER — FENTANYL-BUPIVACAINE-NACL 0.5-0.125-0.9 MG/250ML-% EP SOLN
12.0000 mL/h | EPIDURAL | Status: DC | PRN
  Administered 2021-05-28: 12 mL/h via EPIDURAL
  Filled 2021-05-28: qty 250

## 2021-05-28 MED ORDER — OXYTOCIN-SODIUM CHLORIDE 30-0.9 UT/500ML-% IV SOLN: 2.5000 [IU]/h | INTRAVENOUS | Status: DC

## 2021-05-28 MED ORDER — PHENYLEPHRINE 80 MCG/ML (10ML) SYRINGE FOR IV PUSH (FOR BLOOD PRESSURE SUPPORT)
80.0000 ug | PREFILLED_SYRINGE | INTRAVENOUS | Status: DC | PRN
Start: 2021-05-28 — End: 2021-05-29
  Filled 2021-05-28: qty 10

## 2021-05-28 MED ORDER — SODIUM CHLORIDE 0.9% FLUSH: 3.0000 mL | Freq: Two times a day (BID) | INTRAVENOUS | Status: DC

## 2021-05-28 MED ORDER — OXYTOCIN 10 UNIT/ML IJ SOLN: 10.0000 [IU] | Freq: Once | INTRAMUSCULAR | Status: DC

## 2021-05-28 MED ORDER — ONDANSETRON HCL 4 MG/2ML IJ SOLN
4.0000 mg | Freq: Four times a day (QID) | INTRAMUSCULAR | Status: DC | PRN
  Administered 2021-05-28: 4 mg via INTRAVENOUS
  Filled 2021-05-28: qty 2

## 2021-05-28 MED ORDER — LACTATED RINGERS IV SOLN: INTRAVENOUS | Status: DC

## 2021-05-28 MED ORDER — OXYTOCIN-SODIUM CHLORIDE 30-0.9 UT/500ML-% IV SOLN
1.0000 m[IU]/min | INTRAVENOUS | Status: DC
  Administered 2021-05-28: 2 m[IU]/min via INTRAVENOUS
  Filled 2021-05-28: qty 500

## 2021-05-28 MED ORDER — DIPHENHYDRAMINE HCL 50 MG/ML IJ SOLN: 12.5000 mg | INTRAMUSCULAR | Status: DC | PRN

## 2021-05-28 MED ORDER — LACTATED RINGERS IV SOLN
500.0000 mL | Freq: Once | INTRAVENOUS | Status: AC
  Administered 2021-05-28: 500 mL via INTRAVENOUS

## 2021-05-28 MED ORDER — EPHEDRINE 5 MG/ML INJ: 10.0000 mg | INTRAVENOUS | Status: DC | PRN

## 2021-05-28 MED ORDER — LIDOCAINE HCL (PF) 1 % IJ SOLN
INTRAMUSCULAR | Status: DC | PRN
  Administered 2021-05-28: 10 mL via EPIDURAL

## 2021-05-28 MED ORDER — ACETAMINOPHEN 500 MG PO TABS: 1000.0000 mg | ORAL_TABLET | Freq: Four times a day (QID) | ORAL | Status: DC | PRN

## 2021-05-28 MED ORDER — LABETALOL HCL 5 MG/ML IV SOLN: 80.0000 mg | INTRAVENOUS | Status: DC | PRN

## 2021-05-28 MED ORDER — LABETALOL HCL 100 MG PO TABS
100.0000 mg | ORAL_TABLET | Freq: Two times a day (BID) | ORAL | Status: DC
  Administered 2021-05-28: 100 mg via ORAL
  Filled 2021-05-28: qty 1

## 2021-05-28 MED ORDER — LACTATED RINGERS IV SOLN: 500.0000 mL | INTRAVENOUS | Status: DC | PRN

## 2021-05-28 MED ORDER — PHENYLEPHRINE 80 MCG/ML (10ML) SYRINGE FOR IV PUSH (FOR BLOOD PRESSURE SUPPORT): 80.0000 ug | PREFILLED_SYRINGE | INTRAVENOUS | Status: DC | PRN

## 2021-05-28 MED ORDER — SODIUM CHLORIDE 0.9% FLUSH: 3.0000 mL | INTRAVENOUS | Status: DC | PRN

## 2021-05-28 MED ORDER — LIDOCAINE HCL (PF) 1 % IJ SOLN: 30.0000 mL | INTRAMUSCULAR | Status: DC | PRN

## 2021-05-28 MED ORDER — LABETALOL HCL 5 MG/ML IV SOLN: 40.0000 mg | INTRAVENOUS | Status: DC | PRN

## 2021-05-28 MED ORDER — TERBUTALINE SULFATE 1 MG/ML IJ SOLN
0.2500 mg | Freq: Once | INTRAMUSCULAR | Status: DC | PRN
Start: 2021-05-28 — End: 2021-05-29

## 2021-05-28 MED ORDER — LEVOTHYROXINE SODIUM 100 MCG PO TABS
100.0000 ug | ORAL_TABLET | Freq: Every day | ORAL | Status: DC
  Administered 2021-05-28: 100 ug via ORAL
  Filled 2021-05-28 (×2): qty 1

## 2021-05-28 MED ORDER — OXYTOCIN BOLUS FROM INFUSION: 333.0000 mL | Freq: Once | INTRAVENOUS | Status: DC

## 2021-05-28 MED ORDER — LABETALOL HCL 5 MG/ML IV SOLN: 20.0000 mg | INTRAVENOUS | Status: DC | PRN

## 2021-05-28 MED ORDER — SODIUM CHLORIDE 0.9 % IV SOLN
250.0000 mL | INTRAVENOUS | Status: DC | PRN
Start: 2021-05-28 — End: 2021-05-29

## 2021-05-28 MED ORDER — HYDRALAZINE HCL 20 MG/ML IJ SOLN: 10.0000 mg | INTRAMUSCULAR | Status: DC | PRN

## 2021-05-28 NOTE — Anesthesia Preprocedure Evaluation (Addendum)
Anesthesia Evaluation  ?Patient identified by MRN, date of birth, ID band ?Patient awake ? ? ? ?Reviewed: ?Allergy & Precautions, H&P , NPO status , Patient's Chart, lab work & pertinent test results ? ?History of Anesthesia Complications ?Negative for: history of anesthetic complications ? ?Airway ?Mallampati: II ? ?TM Distance: >3 FB ? ? ? ? Dental ?  ?Pulmonary ?neg pulmonary ROS,  ?  ?Pulmonary exam normal ? ? ? ? ? ? ? Cardiovascular ?hypertension (PIH),  ?Rhythm:regular Rate:Normal ? ? ?  ?Neuro/Psych ?negative neurological ROS ? negative psych ROS  ? GI/Hepatic ?negative GI ROS, Neg liver ROS,   ?Endo/Other  ?Hypothyroidism Morbid obesity ? Renal/GU ?  ? ?  ?Musculoskeletal ? ? Abdominal ?  ?Peds ? Hematology ?negative hematology ROS ?(+)   ?Anesthesia Other Findings ? ? Reproductive/Obstetrics ?(+) Pregnancy (TOLAC) ? ?  ? ? ? ? ? ? ? ? ? ? ? ? ? ?  ?  ? ? ? ? ? ? ? ? ?Anesthesia Physical ?Anesthesia Plan ? ?ASA: 3 ? ?Anesthesia Plan: Epidural  ? ?Post-op Pain Management:   ? ?Induction:  ? ?PONV Risk Score and Plan:  ? ?Airway Management Planned:  ? ?Additional Equipment:  ? ?Intra-op Plan:  ? ?Post-operative Plan:  ? ?Informed Consent: I have reviewed the patients History and Physical, chart, labs and discussed the procedure including the risks, benefits and alternatives for the proposed anesthesia with the patient or authorized representative who has indicated his/her understanding and acceptance.  ? ? ? ? ? ?Plan Discussed with:  ? ?Anesthesia Plan Comments:   ? ? ? ? ? ? ?Anesthesia Quick Evaluation ? ?

## 2021-05-28 NOTE — H&P (Signed)
OB ADMISSION/ HISTORY & PHYSICAL: ? ?Admission Date: 05/28/2021 12:03 AM  ?Admit Diagnosis: Encounter for induction of labor [Z34.90]   ? ?Christina Costa is a 31 y.o. female G3P1011 at [redacted]w[redacted]d presenting for induction of labor for gestational hypertension. Diagnosed at [redacted]w[redacted]d with normal PEC labs, patient was started on Labetalol 100mg  BID and scheduled for IOL. Strongly desires TOLAC. G1 was a PLTCS for breech presentation and gHTN. Vertex by U/S today. Denies PEC symptoms. Denies contractions, leaking of fluid, or vaginal bleeding. Endorses + fetal movement. Husband, , present and supportive. Eagerly anticipating baby girl "Christina Costa".  ? ?Prenatal History: ?G3P1011   ?EDC: 06/13/2021 ?Prenatal care at [facility] since 28 weeks, transfer from [facility]  ?Primary: [facility]. [facility], CNM ? ?Prenatal course complicated by: ?Gestational hypertension - diagnosed at [redacted]w[redacted]d, normal PEC labs, started on Labetalol 100mg  BID, history of gHTN with G1, did not start bbASA until 36 weeks ?TOLAC - G1 was PLTCS for breech presentation ?Hypothyroidism - stable on Synthroid [redacted]w[redacted]d ?Gestational thrombocytopenia - last platelets stable at 37 weeks ?History of septate uterus, present on 1st trimester U/S with this pregnancy ?Obesity, BMI 44 ? ?Prenatal Labs: ?ABO, Rh: B (02/15 0000)  ?Antibody: NEG (04/18 0030) ?Rubella: Immune (02/15 0000)  ?RPR: Nonreactive (02/15 0000)  ?HBsAg: Negative (02/15 0000)  ?HIV: Non-reactive (02/15 0000)  ?GBS: Negative/-- (03/30 0000)  ?1 hr Glucola : 97 ?Genetic Screening: Panorama low risk XX ?Ultrasound: normal xx anatomy, posterior/right lateral placenta, AFI WNL, EFW 59% ? ?TDaP          Declines ?COVID-19 Declines ? ?  ?Maternal Diabetes: No ?Genetic Screening: Normal ?Maternal Ultrasounds/Referrals: Normal ?Fetal Ultrasounds or other Referrals:  None ?Maternal Substance Abuse:  No ?Significant Maternal Medications:  Meds include: Syntroid ?Significant Maternal Lab Results:  Group B Strep  negative ?Other Comments:  None ? ?Medical / Surgical History : ?Past medical history:  ?Past Medical History:  ?Diagnosis Date  ? Headache   ? Hypothyroidism   ? hypothyroidisn   ? since age 79  ? Pregnancy induced hypertension   ? Seasonal allergies   ?  ?Past surgical history:  ?Past Surgical History:  ?Procedure Laterality Date  ? CESAREAN SECTION N/A 07/09/2018  ? Procedure: CESAREAN SECTION;  Surgeon: 14, MD;  Location: [facility] LD ORS;  Service: Obstetrics;  Laterality: N/A;  Primary ? edc 07/27/18 ?NKDA ?need RNFA  ? CESAREAN SECTION N/A   ? [employer] 06/17/2020  ? DILATION AND EVACUATION N/A 09/16/2017  ? Procedure: DILATATION AND EVACUATION;  Surgeon: 08/17/2020, MD;  Location: [facility] ORS;  Service: Gynecology;  Laterality: N/A;  ? HAND SURGERY  01/2019  ? permanent retainers    ? upper and lower   ? WISDOM TOOTH EXTRACTION    ?  ?Family History:  ?Family History  ?Problem Relation Age of Onset  ? Allergy (severe) Mother   ? Hypothyroidism Mother   ? Arthritis Maternal Grandmother   ? Heart disease Maternal Grandfather   ?  ?Social History:  reports that she has never smoked. She has never used smokeless tobacco. She reports that she does not drink alcohol and does not use drugs. ? ?Allergies: ?Wheat bran and Gluten meal  ? ?Current Medications at time of admission:  ?Medications Prior to Admission  ?Medication Sig Dispense Refill Last Dose  ? aspirin EC 81 MG tablet Take 81 mg by mouth daily. Swallow whole.   Past Week  ? labetalol (NORMODYNE) 100 MG tablet Take 100 mg by  mouth 2 (two) times daily.   05/27/2021  ? potassium iodide (SSKI) 1 GM/ML solution Take by mouth once.   Past Week  ? Prenatal Vit-Fe Fumarate-FA (PRENATAL MULTIVITAMIN) TABS tablet Take 1 tablet by mouth daily at 12 noon. 30 tablet 1 05/27/2021  ? promethazine (PHENERGAN) 12.5 MG tablet Take 12.5 mg by mouth every 6 (six) hours as needed for nausea or vomiting.   05/27/2021  ? SYNTHROID 75 MCG tablet Take 1 tablet (75 mcg  total) by mouth daily before breakfast. (Patient taking differently: Take 100 mcg by mouth daily before breakfast.) 90 tablet 1 05/27/2021  ? clobetasol cream (TEMOVATE) 0.05 % Apply 1 application topically as directed. Qd to bid aa eczema on body up to 2 weeks, then d/c, avoid face, groin, axilla 60 g 1   ? fluticasone (FLONASE) 50 MCG/ACT nasal spray Place 2 sprays into both nostrils daily. 16 g 6   ?  ?Review of Systems: ?Review of Systems  ?Eyes:  Negative for blurred vision.  ?Gastrointestinal:  Negative for abdominal pain.  ?Neurological:  Negative for headaches.  ? ?Physical Exam: ?Vital signs and nursing notes reviewed. ? ?Patient Vitals for the past 24 hrs: ? BP Temp Temp src Pulse Resp Height Weight  ?05/28/21 0103 (!) 143/89 -- -- 73 16 -- --  ?05/28/21 0052 (!) 154/98 98.4 ?F (36.9 ?C) Oral 75 17 5\' 6"  (1.676 m) 124.8 kg  ?  ?General: AAO x 3, NAD ?Heart: RRR ?Lungs:CTAB ?Abdomen: Gravid, NT ?Extremities: 1+ non-pitting edema ?SVE: Dilation: 1 ?Effacement (%): 30 ?Station: -3 ?Exam by:: [email or web address], CNM  ? ?Foley balloon placed without difficulty. 60cc of fluid instilled in balloon and taped to leg for traction. Patient tolerated procedure well.  ? ?FHR: 130BPM, moderate variability, + accels, no decels ?TOCO: Contractions rare ? ?Labs:   ?Recent Labs  ?  05/28/21 ?05/30/21  ?WBC 8.4  ?HGB 12.8  ?HCT 37.6  ?PLT 141*  ? ?Assessment: ? 31 y.o. G3P1011 at [redacted]w[redacted]d, TOLAC, gHTN ? ?1. Induction of labor ?2. FHR category I ?3. GBS negative ?4. Desires TOLAC, plans unmedicated labor, open to epidural, consent signed ?5. Plans to breastfeed ?6. gHTN, BPs stable on Labetalol 100mg  BID, PEC labs WNL, denies PEC symptoms ?7. Hypothyroidism, stable on levothyroxine 100mg  ?8. Gestational thrombocytopenia - platelets 141 on admission ? ?Plan:  ?1. Admit to BS ?2. Routine L&D orders ?3. Analgesia/anesthesia PRN  ?4. Foley balloon placed for cervical ripening ?5. Will start low dose Pitocin in the morning ?6. Consider AROM in  active labor ?7. Repeat PEC labs on admission, Labetalol protocol for severe range BP ?8. Continuous EFM ?9. Working toward VBAC ? ?Dr. [redacted]w[redacted]d notified of admission/plan of care. ? ? , MSN ?05/28/2021, 1:56 AM  ?

## 2021-05-28 NOTE — Anesthesia Procedure Notes (Signed)
Epidural ?Patient location during procedure: OB ?Start time: 05/28/2021 11:12 AM ?End time: 05/28/2021 11:22 AM ? ?Staffing ?Anesthesiologist: Lucretia Kern, MD ?Performed: anesthesiologist  ? ?Preanesthetic Checklist ?Completed: patient identified, IV checked, risks and benefits discussed, monitors and equipment checked, pre-op evaluation and timeout performed ? ?Epidural ?Patient position: sitting ?Prep: DuraPrep ?Patient monitoring: heart rate, continuous pulse ox and blood pressure ?Approach: midline ?Location: L3-L4 ?Injection technique: LOR air ? ?Needle:  ?Needle type: Tuohy  ?Needle gauge: 17 G ?Needle length: 9 cm ?Needle insertion depth: 7 cm ?Catheter type: closed end flexible ?Catheter size: 19 Gauge ?Catheter at skin depth: 12 cm ?Test dose: negative ? ?Assessment ?Events: blood not aspirated, injection not painful, no injection resistance, no paresthesia and negative IV test ? ?Additional Notes ?Reason for block:procedure for pain ? ? ? ?

## 2021-05-28 NOTE — Progress Notes (Signed)
S: ?Happy and content. Husband, [name], and mother, Alexia Freestone, present and supportive.  ? ?RN reported cervical Foley balloon came out at 0630. ? ?O: ?Vitals:  ? 05/28/21 0600 05/28/21 0615 05/28/21 0700 05/28/21 0805  ?BP: (!) 150/82 (!) 143/85 (!) 142/78 134/79  ?Pulse: 72 74 79 76  ?Resp: 16  17 18   ?Temp:      ?TempSrc:      ?Weight:      ?Height:      ? ?FHT:  FHR: 125 bpm, variability: moderate,  accelerations:  Present,  decelerations:  Absent ?UC:   occasional ?SVE:   Dilation: 1 (internal os is 3cm) ?Effacement (%): 50 ?Station: -3 ?Exam by:: [email or web address], CNM ? ?Foley balloon replaced with patient consent. Tolerated well.  ? ?A / P: ?Induction of labor due to gestational hypertension, s/p Foley balloon overnight, now replaced, TOLAC ? ?Fetal Wellbeing:  Category I ?PEC: Labs stable, BPs mild range, no signs or symptoms of toxicity ?Pain Control:  Labor support without medications ?Anticipated MOD:   Working towards VBAC ? ?Will start Pitocin 2x2 and stop at 5mu until Foley balloon is expelled.  ? ?9m, MSN ?05/28/2021, 8:14 AM ? ?   ?

## 2021-05-28 NOTE — Progress Notes (Addendum)
S: ?Comfortable with epidural.  ? ?O: ?Vitals:  ? 05/28/21 1502 05/28/21 1532 05/28/21 1602 05/28/21 1635  ?BP: (!) 147/92 (!) 151/85 133/78 125/72  ?Pulse: 64 68 66 69  ?Resp:    18  ?Temp:      ?TempSrc:      ?Weight:      ?Height:      ? ?FHT:  FHR: 130 bpm, variability: moderate,  accelerations:  Present,  decelerations:  Absent ?UC:   regular, every 2-4 minutes ?SVE:   Dilation: 4 ?Effacement (%): 60 ?Station: -3 ?Exam by:: A Jalaysia Lobb CNM ? ?Bedside U/S performed and vertex confirmed. ?Foley balloon removed with ease. ?AROM of a moderate amount of clear fluid at 1622.  ?IUPC and FSE placed with ease.  ? ?A / P: ?Induction of labor due to gestational hypertension, Foley balloon expelled, Pitocin at 31mu, AROM with clear fluid, suspect ROP presentation ? ?Fetal Wellbeing:  Category I ?PEC: BPs mild range, no signs or symptoms of toxicity ?Pain Control:  Epidural ?Anticipated MOD:   Working towards NSVB ? ?Monitor MVUs for now. May slowly increase Pitocin until adequate. Encourage frequent position changes to facilitate fetal rotation and descent.  ? ?Dr. Conni Elliot updated on patient status and plan of care.  ? ?Clancy Gourd, MSN ?05/28/2021, 4:40 PM ? ?   ?

## 2021-05-28 NOTE — Progress Notes (Signed)
S: ?Comfortable with epidural. Mother, Chong Sicilian, at the bedside.  ? ?O: ?Vitals:  ? 05/28/21 1142 05/28/21 1147 05/28/21 1152 05/28/21 1202  ?BP: 132/71 122/74 134/76 138/78  ?Pulse: 62 66 65 71  ?Resp: 16 18  18   ?Temp:      ?TempSrc:      ?Weight:      ?Height:      ? ?FHT:  FHR: 135 bpm, variability: moderate,  accelerations:  Present,  decelerations:  Absent ?UC:   irregular, every 4-6 minutes ?SVE:  deferred, Foley balloon in place ? ?A / P: ?Induction of labor due to gestational hypertension, s/p Foley balloon overnight, now replaced, TOLAC ?  ?Fetal Wellbeing:  Category I ?PEC: Labs stable, BPs WNL after epidural placement, no signs or symptoms of toxicity ?Pain Control:  Epidural ?Anticipated MOD:   Working towards VBAC ? ?Continue to increase Pitocin 2x2 until Foley balloon is expelled. AROM and place internals after Foley balloon is expelled.  ? ?Dr. Murrell Redden updated on patient status and plan of care.  ?  ?Zettie Pho, MSN ?05/28/2021, 12:39 PM ? ?   ?

## 2021-05-29 ENCOUNTER — Encounter (HOSPITAL_COMMUNITY)
Admission: AD | Disposition: A | Discharge status: Home / Self Care | Payer: Self-pay | Source: Home / Self Care | Attending: Obstetrics and Gynecology

## 2021-05-29 ENCOUNTER — Encounter (HOSPITAL_COMMUNITY): Payer: Self-pay | Admitting: Obstetrics and Gynecology

## 2021-05-29 DIAGNOSIS — Z98891 History of uterine scar from previous surgery: Secondary | ICD-10-CM

## 2021-05-29 LAB — CBC
HCT: 37.1 % (ref 36.0–46.0)
Hemoglobin: 12.4 g/dL (ref 12.0–15.0)
MCH: 28.8 pg (ref 26.0–34.0)
MCHC: 33.4 g/dL (ref 30.0–36.0)
MCV: 86.1 fL (ref 80.0–100.0)
Platelets: 122 10*3/uL — ABNORMAL LOW (ref 150–400)
RBC: 4.31 MIL/uL (ref 3.87–5.11)
RDW: 14.2 % (ref 11.5–15.5)
WBC: 13.4 10*3/uL — ABNORMAL HIGH (ref 4.0–10.5)

## 2021-05-29 SURGERY — Surgical Case
Anesthesia: Epidural

## 2021-05-29 MED ORDER — LEVOTHYROXINE SODIUM 100 MCG PO TABS
100.0000 ug | ORAL_TABLET | Freq: Every day | ORAL | Status: DC
  Administered 2021-05-29 – 2021-05-31 (×3): 100 ug via ORAL
  Filled 2021-05-29 (×2): qty 1

## 2021-05-29 MED ORDER — ONDANSETRON HCL 4 MG/2ML IJ SOLN
INTRAMUSCULAR | Status: DC | PRN
  Administered 2021-05-29: 4 mg via INTRAVENOUS

## 2021-05-29 MED ORDER — ONDANSETRON HCL 4 MG/2ML IJ SOLN: 4.0000 mg | Freq: Once | INTRAMUSCULAR | Status: DC | PRN

## 2021-05-29 MED ORDER — IBUPROFEN 600 MG PO TABS
600.0000 mg | ORAL_TABLET | Freq: Four times a day (QID) | ORAL | Status: DC
  Administered 2021-05-30 – 2021-05-31 (×5): 600 mg via ORAL
  Filled 2021-05-29 (×5): qty 1

## 2021-05-29 MED ORDER — KETOROLAC TROMETHAMINE 30 MG/ML IJ SOLN: 30.0000 mg | Freq: Once | INTRAMUSCULAR | Status: DC | PRN

## 2021-05-29 MED ORDER — DIPHENHYDRAMINE HCL 50 MG/ML IJ SOLN: 12.5000 mg | INTRAMUSCULAR | Status: DC | PRN

## 2021-05-29 MED ORDER — DEXMEDETOMIDINE (PRECEDEX) IN NS 20 MCG/5ML (4 MCG/ML) IV SYRINGE
PREFILLED_SYRINGE | INTRAVENOUS | Status: DC | PRN
  Administered 2021-05-29: 12 ug via INTRAVENOUS

## 2021-05-29 MED ORDER — HYDROMORPHONE HCL 1 MG/ML IJ SOLN: 0.2000 mg | INTRAMUSCULAR | Status: DC | PRN

## 2021-05-29 MED ORDER — SCOPOLAMINE 1 MG/3DAYS TD PT72
MEDICATED_PATCH | TRANSDERMAL | Status: DC | PRN
  Administered 2021-05-29: 1 via TRANSDERMAL

## 2021-05-29 MED ORDER — SODIUM CHLORIDE 0.9 % IV SOLN: INTRAVENOUS | Status: DC | PRN

## 2021-05-29 MED ORDER — ACETAMINOPHEN 500 MG PO TABS: 1000.0000 mg | ORAL_TABLET | Freq: Four times a day (QID) | ORAL | Status: DC

## 2021-05-29 MED ORDER — HYDROMORPHONE HCL 1 MG/ML IJ SOLN: 0.2500 mg | INTRAMUSCULAR | Status: DC | PRN

## 2021-05-29 MED ORDER — OXYCODONE HCL 5 MG/5ML PO SOLN: 5.0000 mg | Freq: Once | ORAL | Status: DC | PRN

## 2021-05-29 MED ORDER — DIPHENHYDRAMINE HCL 25 MG PO CAPS: 25.0000 mg | ORAL_CAPSULE | ORAL | Status: DC | PRN

## 2021-05-29 MED ORDER — OXYCODONE HCL 5 MG PO TABS: 5.0000 mg | ORAL_TABLET | Freq: Once | ORAL | Status: DC | PRN

## 2021-05-29 MED ORDER — SENNOSIDES-DOCUSATE SODIUM 8.6-50 MG PO TABS
2.0000 | ORAL_TABLET | Freq: Every day | ORAL | Status: DC
  Administered 2021-05-29 – 2021-05-31 (×3): 2 via ORAL
  Filled 2021-05-29 (×4): qty 2

## 2021-05-29 MED ORDER — OXYCODONE HCL 5 MG PO TABS: 5.0000 mg | ORAL_TABLET | ORAL | Status: DC | PRN

## 2021-05-29 MED ORDER — ACETAMINOPHEN 500 MG PO TABS
1000.0000 mg | ORAL_TABLET | Freq: Four times a day (QID) | ORAL | Status: DC
  Administered 2021-05-29 – 2021-05-31 (×9): 1000 mg via ORAL
  Filled 2021-05-29 (×9): qty 2

## 2021-05-29 MED ORDER — KETOROLAC TROMETHAMINE 30 MG/ML IJ SOLN
30.0000 mg | Freq: Four times a day (QID) | INTRAMUSCULAR | Status: AC
  Administered 2021-05-29 (×2): 30 mg via INTRAVENOUS
  Filled 2021-05-29 (×2): qty 1

## 2021-05-29 MED ORDER — PRENATAL MULTIVITAMIN CH
1.0000 | ORAL_TABLET | Freq: Every day | ORAL | Status: DC
  Administered 2021-05-30 – 2021-05-31 (×2): 1 via ORAL
  Filled 2021-05-29 (×2): qty 1

## 2021-05-29 MED ORDER — MENTHOL 3 MG MT LOZG: 1.0000 | LOZENGE | OROMUCOSAL | Status: DC | PRN

## 2021-05-29 MED ORDER — FENTANYL CITRATE (PF) 100 MCG/2ML IJ SOLN
INTRAMUSCULAR | Status: DC | PRN
Start: 2021-05-29 — End: 2021-05-29

## 2021-05-29 MED ORDER — KETOROLAC TROMETHAMINE 30 MG/ML IJ SOLN
30.0000 mg | Freq: Four times a day (QID) | INTRAMUSCULAR | Status: AC | PRN
  Administered 2021-05-29: 30 mg via INTRAMUSCULAR

## 2021-05-29 MED ORDER — STERILE WATER FOR IRRIGATION IR SOLN
Status: DC | PRN
  Administered 2021-05-29: 1

## 2021-05-29 MED ORDER — MEPERIDINE HCL 25 MG/ML IJ SOLN: 6.2500 mg | INTRAMUSCULAR | Status: DC | PRN

## 2021-05-29 MED ORDER — SODIUM CHLORIDE 0.9% FLUSH: 3.0000 mL | INTRAVENOUS | Status: DC | PRN

## 2021-05-29 MED ORDER — OXYTOCIN-SODIUM CHLORIDE 30-0.9 UT/500ML-% IV SOLN
INTRAVENOUS | Status: DC | PRN
  Administered 2021-05-29: 30 [IU] via INTRAVENOUS

## 2021-05-29 MED ORDER — MORPHINE SULFATE (PF) 0.5 MG/ML IJ SOLN
INTRAMUSCULAR | Status: DC | PRN
  Administered 2021-05-29: 3 mg via EPIDURAL

## 2021-05-29 MED ORDER — OXYTOCIN-SODIUM CHLORIDE 30-0.9 UT/500ML-% IV SOLN
INTRAVENOUS | Status: AC
  Filled 2021-05-29: qty 500

## 2021-05-29 MED ORDER — ONDANSETRON HCL 4 MG/2ML IJ SOLN: 4.0000 mg | Freq: Three times a day (TID) | INTRAMUSCULAR | Status: DC | PRN

## 2021-05-29 MED ORDER — DEXTROSE 5 % IV SOLN
INTRAVENOUS | Status: DC | PRN
  Administered 2021-05-29: 3 g via INTRAVENOUS

## 2021-05-29 MED ORDER — SIMETHICONE 80 MG PO CHEW
80.0000 mg | CHEWABLE_TABLET | Freq: Three times a day (TID) | ORAL | Status: DC
  Administered 2021-05-29 – 2021-05-31 (×6): 80 mg via ORAL
  Filled 2021-05-29 (×6): qty 1

## 2021-05-29 MED ORDER — OXYTOCIN-SODIUM CHLORIDE 30-0.9 UT/500ML-% IV SOLN: 2.5000 [IU]/h | INTRAVENOUS | Status: AC

## 2021-05-29 MED ORDER — ZOLPIDEM TARTRATE 5 MG PO TABS: 5.0000 mg | ORAL_TABLET | Freq: Every evening | ORAL | Status: DC | PRN

## 2021-05-29 MED ORDER — FENTANYL CITRATE (PF) 100 MCG/2ML IJ SOLN
INTRAMUSCULAR | Status: DC | PRN
  Administered 2021-05-29: 50 ug via INTRAVENOUS

## 2021-05-29 MED ORDER — FENTANYL CITRATE (PF) 100 MCG/2ML IJ SOLN
INTRAMUSCULAR | Status: AC
Start: 2021-05-29 — End: ?
  Filled 2021-05-29: qty 2

## 2021-05-29 MED ORDER — NALOXONE HCL 4 MG/10ML IJ SOLN
1.0000 ug/kg/h | INTRAVENOUS | Status: DC | PRN
  Filled 2021-05-29: qty 5

## 2021-05-29 MED ORDER — MORPHINE SULFATE (PF) 0.5 MG/ML IJ SOLN
INTRAMUSCULAR | Status: AC
  Filled 2021-05-29: qty 10

## 2021-05-29 MED ORDER — SCOPOLAMINE 1 MG/3DAYS TD PT72
1.0000 | MEDICATED_PATCH | Freq: Once | TRANSDERMAL | Status: DC
Start: 2021-05-29 — End: 2021-05-31

## 2021-05-29 MED ORDER — AMISULPRIDE (ANTIEMETIC) 5 MG/2ML IV SOLN: 10.0000 mg | Freq: Once | INTRAVENOUS | Status: DC | PRN

## 2021-05-29 MED ORDER — DIPHENHYDRAMINE HCL 25 MG PO CAPS: 25.0000 mg | ORAL_CAPSULE | Freq: Four times a day (QID) | ORAL | Status: DC | PRN

## 2021-05-29 MED ORDER — SODIUM CHLORIDE 0.9 % IV SOLN
INTRAVENOUS | Status: DC | PRN
  Administered 2021-05-29: 500 mg via INTRAVENOUS

## 2021-05-29 MED ORDER — TETANUS-DIPHTH-ACELL PERTUSSIS 5-2.5-18.5 LF-MCG/0.5 IM SUSY: 0.5000 mL | PREFILLED_SYRINGE | Freq: Once | INTRAMUSCULAR | Status: DC

## 2021-05-29 MED ORDER — NIFEDIPINE ER OSMOTIC RELEASE 30 MG PO TB24
30.0000 mg | ORAL_TABLET | Freq: Every day | ORAL | Status: DC
  Administered 2021-05-29 – 2021-05-31 (×3): 30 mg via ORAL
  Filled 2021-05-29 (×3): qty 1

## 2021-05-29 MED ORDER — WITCH HAZEL-GLYCERIN EX PADS: 1.0000 "application " | MEDICATED_PAD | CUTANEOUS | Status: DC | PRN

## 2021-05-29 MED ORDER — SIMETHICONE 80 MG PO CHEW: 80.0000 mg | CHEWABLE_TABLET | ORAL | Status: DC | PRN

## 2021-05-29 MED ORDER — FENTANYL CITRATE (PF) 100 MCG/2ML IJ SOLN
INTRAMUSCULAR | Status: AC
  Filled 2021-05-29: qty 2

## 2021-05-29 MED ORDER — LACTATED RINGERS IV SOLN: INTRAVENOUS | Status: DC | PRN

## 2021-05-29 MED ORDER — FENTANYL CITRATE (PF) 100 MCG/2ML IJ SOLN
INTRAMUSCULAR | Status: DC | PRN
Start: 2021-05-29 — End: 2021-05-29
  Administered 2021-05-29: 100 ug via EPIDURAL

## 2021-05-29 MED ORDER — KETOROLAC TROMETHAMINE 30 MG/ML IJ SOLN
INTRAMUSCULAR | Status: AC
Start: 2021-05-29 — End: ?
  Filled 2021-05-29: qty 1

## 2021-05-29 MED ORDER — DIBUCAINE (PERIANAL) 1 % EX OINT: 1.0000 "application " | TOPICAL_OINTMENT | CUTANEOUS | Status: DC | PRN

## 2021-05-29 MED ORDER — NALOXONE HCL 0.4 MG/ML IJ SOLN: 0.4000 mg | INTRAMUSCULAR | Status: DC | PRN

## 2021-05-29 MED ORDER — KETOROLAC TROMETHAMINE 30 MG/ML IJ SOLN: 30.0000 mg | Freq: Four times a day (QID) | INTRAMUSCULAR | Status: AC | PRN

## 2021-05-29 MED ORDER — COCONUT OIL OIL: 1.0000 "application " | TOPICAL_OIL | Status: DC | PRN

## 2021-05-29 MED ORDER — DEXAMETHASONE SODIUM PHOSPHATE 10 MG/ML IJ SOLN
INTRAMUSCULAR | Status: DC | PRN
  Administered 2021-05-29: 10 mg via INTRAVENOUS

## 2021-05-29 MED ORDER — LIDOCAINE-EPINEPHRINE (PF) 2 %-1:200000 IJ SOLN
INTRAMUSCULAR | Status: DC | PRN
  Administered 2021-05-29: 10 mL via EPIDURAL
  Administered 2021-05-29: 3 mL via EPIDURAL
  Administered 2021-05-29: 2 mL via EPIDURAL

## 2021-05-29 SURGICAL SUPPLY — 36 items
BENZOIN TINCTURE PRP APPL 2/3 (GAUZE/BANDAGES/DRESSINGS) ×1 IMPLANT
CHLORAPREP W/TINT 26ML (MISCELLANEOUS) ×4 IMPLANT
CLAMP CORD UMBIL (MISCELLANEOUS) ×2 IMPLANT
CLOSURE STERI STRIP 1/2 X4 (GAUZE/BANDAGES/DRESSINGS) ×1 IMPLANT
CLOTH BEACON ORANGE TIMEOUT ST (SAFETY) ×2 IMPLANT
DRSG OPSITE POSTOP 4X10 (GAUZE/BANDAGES/DRESSINGS) ×2 IMPLANT
ELECT REM PT RETURN 9FT ADLT (ELECTROSURGICAL) ×2
ELECTRODE REM PT RTRN 9FT ADLT (ELECTROSURGICAL) ×1 IMPLANT
EXTRACTOR VACUUM KIWI (MISCELLANEOUS) IMPLANT
GLOVE BIOGEL PI IND STRL 7.0 (GLOVE) ×3 IMPLANT
GLOVE BIOGEL PI INDICATOR 7.0 (GLOVE) ×3
GLOVE ECLIPSE 6.5 STRL STRAW (GLOVE) ×2 IMPLANT
GOWN STRL REUS W/TWL LRG LVL3 (GOWN DISPOSABLE) ×4 IMPLANT
KIT ABG SYR 3ML LUER SLIP (SYRINGE) IMPLANT
LIGASURE IMPACT 36 18CM CVD LR (INSTRUMENTS) ×2 IMPLANT
MAT PREVALON FULL STRYKER (MISCELLANEOUS) ×1 IMPLANT
NDL HYPO 25X5/8 SAFETYGLIDE (NEEDLE) IMPLANT
NEEDLE HYPO 25X5/8 SAFETYGLIDE (NEEDLE) IMPLANT
NS IRRIG 1000ML POUR BTL (IV SOLUTION) ×2 IMPLANT
PACK C SECTION WH (CUSTOM PROCEDURE TRAY) ×2 IMPLANT
PAD OB MATERNITY 4.3X12.25 (PERSONAL CARE ITEMS) ×2 IMPLANT
STRIP CLOSURE SKIN 1/2X4 (GAUZE/BANDAGES/DRESSINGS) IMPLANT
SUT MNCRL 0 VIOLET CTX 36 (SUTURE) ×2 IMPLANT
SUT MONOCRYL 0 CTX 36 (SUTURE) ×2
SUT PLAIN 0 NONE (SUTURE) IMPLANT
SUT PLAIN 2 0 (SUTURE) ×1
SUT PLAIN 2 0 XLH (SUTURE) ×1 IMPLANT
SUT PLAIN ABS 2-0 CT1 27XMFL (SUTURE) ×1 IMPLANT
SUT VIC AB 0 CT1 27 (SUTURE) ×1
SUT VIC AB 0 CT1 27XBRD ANBCTR (SUTURE) ×1 IMPLANT
SUT VIC AB 2-0 CT1 27 (SUTURE) ×1
SUT VIC AB 2-0 CT1 TAPERPNT 27 (SUTURE) IMPLANT
SUT VIC AB 4-0 KS 27 (SUTURE) ×2 IMPLANT
TOWEL OR 17X24 6PK STRL BLUE (TOWEL DISPOSABLE) ×2 IMPLANT
TRAY FOLEY W/BAG SLVR 14FR LF (SET/KITS/TRAYS/PACK) IMPLANT
WATER STERILE IRR 1000ML POUR (IV SOLUTION) ×2 IMPLANT

## 2021-05-29 NOTE — Progress Notes (Addendum)
S: ?Comfortable with epidural. Sleeping.  ? ?O: ?Vitals:  ? 05/29/21 0106 05/29/21 0107 05/29/21 0125 05/29/21 0130  ?BP:  (!) 117/55  114/65  ?Pulse:  67  70  ?Resp:      ?Temp:      ?TempSrc:      ?SpO2: 99%  100% 100%  ?Weight:      ?Height:      ? ?FHT:  FHR: 145 bpm, variability: minimal ,  accelerations:  Present,  decelerations:  Present late decels that resolved after turning Pitocin off ?UC:   irregular, every 6-7 minutes ?SVE:   Dilation: 4-5 ?Effacement (%): 70 ?Station: -2 ?Exam by: Dorisann Frames, CNM ? ?A / P: ?Induction of labor due to gestational hypertension, gHTN, TOLAC, fetal intolerance to labor ? ?Fetal Wellbeing:  Category I and Category II ?Pain Control:  Epidural ?PEC: BP WNL, labs stable, no signs or symptoms of toxicity ?I/D: 99.46F last temp, warm internal temp ?Anticipated MOD:  RLTCS ? ?CNM VE unchanged from previous exam. Cat II FHT with Pitocin. Dr. Conni Elliot consulted.  ? ?Discussed arrest of dilatation and fetal intolerance to labor with patient and family. Will proceed with repeat cesarean section.  ? ?Risk of C/S reviewed including infection, bleeding, possible need for blood transfusion and the risk including HIV, hepatitis, fever/rash, injury to surrounding organ structures and internal scar tissue. All questions answered.  ? ?Clancy Gourd, MSN ?05/29/2021, 1:50 AM ? ?Physician Addendum: ? ?Patient seen and evaluated at bedside. IOL for gHTN with history of previous c/s x1 for breech. Patient had IOL with Foley balloon x2, AROM, and Pitocin with slow progress. Unchanged cervical exam, ruptured for 10 hours, and now with fetal intolerance to labor. Reassuring fetal status currently with Pitocin off, moderate variability and accelerations present and no decelerations, however was having recurrent variables with Pitocin on previously. Recommend proceeding with cesarean delivery at this time for fetal intolerance to labor and arrest of dilation, and patient agrees. Patient consented for  repeat cesarean section, counseled on risks of surgery including but not limited to bleeding, infection, damage to surrounding organs, increase risks to future pregnancies, and VTE risks. She is agreeable to blood transfusion if clinically indicated. All questions answered to patient satisfaction and consents signed. ? ?OR team notified ?Ancef 3g and Azithromycin 500mg  IV antibiotic ppx ?SCD VTE ppx ?Routine intraop care ? ?Jahmai Finelli A Kiana Hollar ?05/29/21 ?2:36 AM ? ?   ?

## 2021-05-29 NOTE — Op Note (Signed)
Christina Costa ?[birth year] ?[id number] ? ? ?OPERATIVE NOTE ? ?PROCEDURE: repeat low transverse cesarean section ? ?PRE-OPERATIVE DIAGNOSIS:  ?Single intrauterine pregnancy at 37 weeks 6 days ?History of previous low transverse cesarean section ?Non-reassuring fetal heart tracing ?Arrest of dilation ?Gestational hypertension ? ?POST-OPERATIVE DIAGNOSIS: ?Single intrauterine pregnancy at 37 weeks 6 days, delivered ?History of previous low transverse cesarean section ?Non-reassuring fetal heart tracing ?Arrest of dilation ?Gestational hypertension ?Uterine septum ? ?SURGEON: Langley Gauss, DO ? ?ASSISTANT: Gavin Potters, CNM ? ?FINDINGS: adhesive disease involving omentum to anterior lower fascia and bladder planes, gravid female anatomy with a partial uterine septum appreciated fundally approximately 3-4 cm in length in 2 cm in width; viable healthy female infant delivered in the direct OP presentation apgars 8 and 9 at the 1 and 5 minutes respectively weight 6 pounds 3 ounces ? ?EBL: 430 cc ? ?FLUIDS: 1,400 cc ? ?MEDICATIONS: Ancef 3 g Azithromycin 500 mg ? ?URINE OUTPUT: 150 cc ? ?COMPLICATIONS: None ? ?PROCEDURE IN DETAIL: ? ?After the patient was appropriately consented she was taken to the operating room where regional anesthesia was found to be adequate. The patient was placed in the dorsal supine position with leftward tilt. Fetal heart tones were obtained and found to be reassuring. A Foley catheter was previously placed and the bladder was draining for clear yellow urine and remained in place for the duration of the procedure. The patient was prepped and draped in the usual sterile fashion. An appropriate time out was performed that verified the correct patient, procedure, and surgical team.  ? ?The scalpel was used to make a low transverse skin incision. The incision was carried down to the fascia, maintaining hemostasis with the Bovie as needed, The fascia was incised to the left and the right of the midline.  The fascia incision was carried laterally using the curved Mayo scissors on either side. The inferior aspect of the fascia was grasped with the Kocher clamps, tented upwards, and the underlying rectus muscle dissected off bluntly and sharply with curved Mayo scissors. The Kocher clamps were removed, placed on the superior aspect of the fascia and the rectus muscles dissected off in a similar fashion. The rectus muscles were divided at the midline bluntly. The peritoneum was identified, grasped with hemostat clamps and entered sharply with the Metzenbaum scissors. The omentum adhesions were identified and taken down with the Bovie. The Alexis retractor was introduced. The vesicouterine peritoneum was dissected off the lower uterine segment and a bladder flap was created digitally. The scalpel was used to make a low transverse uterine incision. The incision was extended caudad and cephalad by bluntly stretching. The amniotic membranes were ruptured for clear fluid. The infant was found to be in the cephalic presentation. The infant's head was delivered through the hysterotomy while maintaining adequate flexion at the neck. The infant's remaining shoulders and body were subsequently delivered without difficulties. The infant was dried, stimulated, and handed off to the awaiting neonatal team. The placenta was delivered spontaneously intact by gentle traction. The uterine cavity was cleared of any clot and debris. The uterine septum was appreciated as described in the findings. The hysterotomy was closed using 0-Monocryl in a running locking fashion. A second layer closure was performed using the same suture. Adequate hemostasis of the hysterotomy was noted. The bilateral gutters were cleared of all clot and debris. Bilateral tubes and ovaries were inspected and found to be within normal limits. The underlying fascia planes were inspected and found to be hemostatic. The fascia  was closed with 0-Vicryl a normal running  fashion. The subcutaneous layer was inspected and hemostasis obtained with the Bovie. The subcutaneous layer was closed with 2-0 plain. The skin layer was closed with 4-0 Vicryl. The patient tolerated the procedure well and was taken to the recovery room in stable condition. All instrument, needle, and sponge counts were correct.  ? ?Christina Costa A Christina Costa ?05/29/21 ?4:30 AM ? ? ?

## 2021-05-29 NOTE — Transfer of Care (Signed)
Immediate Anesthesia Transfer of Care Note ? ?Patient: Christina Costa ? ?Procedure(s) Performed: CESAREAN SECTION ? ?Patient Location: PACU ? ?Anesthesia Type:Epidural ? ?Level of Consciousness: awake, alert  and oriented ? ?Airway & Oxygen Therapy: Patient Spontanous Breathing ? ?Post-op Assessment: Report given to RN and Post -op Vital signs reviewed and stable ? ?Post vital signs: Reviewed and stable ? ?Last Vitals:  ?Vitals Value Taken Time  ?BP 128/72 05/29/21 0433  ?Temp    ?Pulse 70 05/29/21 0441  ?Resp 17 05/29/21 0441  ?SpO2 100 % 05/29/21 0441  ?Vitals shown include unvalidated device data. ? ?Last Pain:  ?Vitals:  ? 05/29/21 0204  ?TempSrc:   ?PainSc: 0-No pain  ?   ? ?  ? ?Complications: No notable events documented. ?

## 2021-05-29 NOTE — Lactation Note (Signed)
This note was copied from a baby's chart. ?Lactation Consultation Note ? ?Patient Name: Christina Costa ?Today's Date: 05/29/2021 ?Reason for consult: Initial assessment;Mother's request;Early term 37-38.6wks;Breastfeeding assistance;Maternal endocrine disorder (PIH ( Nifedipine)) ?Age:30 hours ?LC assisted with latching infant with signs of milk transfer.  ? ?Plan 1. To feed  based on cues 8-12x 24hr period. Mom to offer breasts and look for signs of milk transfer.  ?2. If unable to latch, Mom to offer EBM on spoon then try a latch.  ?3. I and O sheet reviewed.  ?All questions answered at the end of the visit.  ?Mom has Medela pump in the room.  ? ?Maternal Data ?Does the patient have breastfeeding experience prior to this delivery?: Yes ?How long did the patient breastfeed?: 9 months ? ?Feeding ?Mother's Current Feeding Choice: Breast Milk ? ?LATCH Score ?Latch: Grasps breast easily, tongue down, lips flanged, rhythmical sucking. ? ?Audible Swallowing: Spontaneous and intermittent ? ?Type of Nipple: Everted at rest and after stimulation ? ?Comfort (Breast/Nipple): Soft / non-tender ? ?Hold (Positioning): Assistance needed to correctly position infant at breast and maintain latch. ? ?LATCH Score: 9 ? ? ?Lactation Tools Discussed/Used ?  ? ?Interventions ?Interventions: Breast feeding basics reviewed;Assisted with latch;Skin to skin;Breast massage;Hand express;Breast compression;Adjust position;Support pillows;Position options;Expressed milk;Education;LC Magazine features editor;Infant Driven Feeding Algorithm education ? ?Discharge ?Pump: Personal ? ?Consult Status ?Consult Status: Follow-up ?Date: 05/30/21 ?Follow-up type: In-patient ? ? ? ?Lasonia Casino  Nicholson-Springer ?05/29/2021, 6:14 PM ? ? ? ?

## 2021-05-29 NOTE — Anesthesia Postprocedure Evaluation (Signed)
Anesthesia Post Note ? ?Patient: Christina Costa ? ?Procedure(s) Performed: CESAREAN SECTION ? ?  ? ?Patient location during evaluation: PACU ?Anesthesia Type: Epidural ?Level of consciousness: awake and alert and oriented ?Pain management: pain level controlled ?Vital Signs Assessment: post-procedure vital signs reviewed and stable ?Respiratory status: spontaneous breathing, nonlabored ventilation and respiratory function stable ?Cardiovascular status: blood pressure returned to baseline and stable ?Postop Assessment: no headache, no backache, patient able to bend at knees and epidural receding ?Anesthetic complications: no ? ? ?No notable events documented. ? ?Last Vitals:  ?Vitals:  ? 05/29/21 0445 05/29/21 0500  ?BP: 122/73   ?Pulse: 70   ?Resp: 15   ?Temp:  36.9 ?C  ?SpO2: 100%   ?  ?Last Pain:  ?Vitals:  ? 05/29/21 0500  ?TempSrc:   ?PainSc: 4   ? ?Pain Goal:   ? ?LLE Motor Response: Purposeful movement (05/29/21 0500) ?LLE Sensation: Numbness (05/29/21 0500) ?RLE Motor Response: No movement due to regional block (05/29/21 0500) ?RLE Sensation: No sensation (absent) (05/29/21 0500) ?  ?  ?Epidural/Spinal Function Cutaneous sensation: Vague (05/29/21 0500), Patient able to flex knees: Yes (05/29/21 0500), Patient able to lift hips off bed: No (05/29/21 0500), Back pain beyond tenderness at insertion site: No (05/29/21 0500), Progressively worsening motor and/or sensory loss: No (05/29/21 0500), Bowel and/or bladder incontinence post epidural: No (05/29/21 0500) ? ?Tennis Must Rayshad Riviello ? ? ? ? ?

## 2021-05-30 MED ORDER — FUROSEMIDE 20 MG PO TABS
20.0000 mg | ORAL_TABLET | Freq: Every day | ORAL | Status: DC
  Administered 2021-05-30 – 2021-05-31 (×2): 20 mg via ORAL
  Filled 2021-05-30 (×2): qty 1

## 2021-05-30 MED ORDER — SALINE SPRAY 0.65 % NA SOLN: 1.0000 | NASAL | Status: DC | PRN

## 2021-05-30 NOTE — Lactation Note (Signed)
This note was copied from a baby's chart. ?Lactation Consultation Note ?Mom very tired and frustrated d/t baby will not sleep in her crib and wants to be on the breast and sleep w/nipple in her mouth. ?Reviewed cluster feeding. ?LC discussed spoon feeding colostrum. LC demonstrated hand expression and mom returned demonstration. ?Baby sleeping after spoon feeding. ?Encouraged mom to get FOB to hold baby while she gets some sleep. ? ?Patient Name: Christina Costa ?Today's Date: 05/30/2021 ?Reason for consult: Mother's request;Early term 37-38.6wks ?Age:30 hours ? ?Maternal Data ?Has patient been taught Hand Expression?: Yes ? ?Feeding ?  ? ?LATCH Score ?Latch: Grasps breast easily, tongue down, lips flanged, rhythmical sucking. ? ?Audible Swallowing: A few with stimulation ? ?Type of Nipple: Everted at rest and after stimulation ? ?Comfort (Breast/Nipple): Soft / non-tender ? ?Hold (Positioning): No assistance needed to correctly position infant at breast. ? ?LATCH Score: 9 ? ? ?Lactation Tools Discussed/Used ?  ? ?Interventions ?Interventions: Breast feeding basics reviewed;Skin to skin;Breast compression;Support pillows;Position options;Expressed milk ? ?Discharge ?  ? ?Consult Status ?Consult Status: Follow-up ?Date: 05/30/21 ?Follow-up type: In-patient ? ? ? ?Charyl Dancer ?05/30/2021, 2:58 AM ? ? ? ?

## 2021-05-30 NOTE — Progress Notes (Signed)
? ?Subjective: ?POD# 1 ?Live born female  ?Birth Weight: 6 lb 3.5 oz (2820 g) ?APGAR: 8, 9 ? ?Newborn Delivery   ?Birth date/time: 05/29/2021 03:30:00 ?Delivery type: C-Section, Low Transverse ?Trial of labor: Yes ?C-section categorization: Repeat ?  ?  ?Baby name: Conley Simmonds ?Delivering provider: LAW, CASSANDRA A  ? ?Feeding: breast ? ?Pain control at delivery: Epidural  ? ?Reports feeling well. ? ?Patient reports tolerating PO.   ?Breast symptoms:none ?Pain controlled with  PO meds ?Denies HA/SOB/C/P/N/V/dizziness. Flatus absent. ?She reports vaginal bleeding as normal, without clots.  She is ambulating, urinating without difficulty.    ? ?Objective: ?  VS:  ?  ?Vitals:  ? 05/29/21 1433 05/29/21 1738 05/29/21 2202 05/30/21 0552  ?BP: 105/65 121/71 119/67 126/75  ?Pulse: 70 80 70 70  ?Resp: 18 18 17 18   ?Temp:  98.2 ?F (36.8 ?C) 98.4 ?F (36.9 ?C) 98.2 ?F (36.8 ?C)  ?TempSrc:   Oral Oral  ?SpO2:  98% 99% 99%  ?Weight:      ?Height:      ? ?  ?Intake/Output Summary (Last 24 hours) at 05/30/2021 0826 ?Last data filed at 05/30/2021 0230 ?Gross per 24 hour  ?Intake 491.7 ml  ?Output 875 ml  ?Net -383.3 ml  ?   ? ?  ?Recent Labs  ?  05/28/21 ?1025 05/29/21 ?05/31/21  ?WBC 10.2 13.4*  ?HGB 13.7 12.4  ?HCT 40.1 37.1  ?PLT 143* 122*  ? ? ?  Latest Ref Rng & Units 05/28/2021  ? 12:33 AM 08/22/2019  ?  2:19 PM 12/01/2014  ?  2:13 PM  ?CMP  ?Glucose 70 - 99 mg/dL 12/03/2014   84   97    ?BUN 6 - 20 mg/dL 10   12   9     ?Creatinine 0.44 - 1.00 mg/dL 756     4.33    ?Sodium 135 - 145 mmol/L 137   138   139    ?Potassium 3.5 - 5.1 mmol/L 3.6   4.4   4.1    ?Chloride 98 - 111 mmol/L 108   101   103    ?CO2 22 - 32 mmol/L 20    29    ?Calcium 8.9 - 10.3 mg/dL 8.9   9.8   9.7    ?Total Protein 6.5 - 8.1 g/dL 6.4   7.3   7.5    ?Total Bilirubin 0.3 - 1.2 mg/dL 0.4   0.2   0.3    ?Alkaline Phos 38 - 126 U/L 96   70   53    ?AST 15 - 41 U/L 20   23   18     ?ALT 0 - 44 U/L 18    20    ?  ? ? Blood type: --/--/B POS (04/18 0030) ? Rubella: Immune  (02/15 0000)  ?Vaccines: TDaP          declined ?        Flu             declined ?                   COVID-19 declined ? ? Physical Exam:  ?General: alert, cooperative, and no distress ?CV: Regular rate and rhythm ?Resp: clear ?Abdomen: soft, nontender, normal bowel sounds ?Incision: clean, dry, and intact ?Uterine Fundus: firm, below umbilicus, nontender ?Lochia: minimal ?Ext: edema pitting and generalized ? ?Assessment/Plan: ?31 y.o.   POD# 1. 08-26-1995  ?                ?  Principal Problem: ?  Postpartum care following cesarean delivery 4/19 ?Active Problems: ?  Hypothyroidism ? - stable on synthroid 100 mcg daily ?  Encounter for induction of labor ?  Gestational hypertension ? - normotensive on Procardia 30 XL, no neural sx ? - continue to monitor closely ? Dependent edema ? - start furosemide 20 mg daily x 3 ?  Gestational thrombocytopenia ? - no sx of hemorrhage, no evidence of HELLP ?  Failed trial of labor, AOD and fetal intolerance ?  Repeat low transverse cesarean section 4/19 ? ? ?Doing well, stable.    ?           ?Advance diet as tolerated ?Encourage rest when baby rests ?Breastfeeding support ?Encourage to ambulate ?Routine post-op care ? ?Neta Mends, CNM, MSN ?05/30/2021, 8:26 AM ? ?  ?

## 2021-05-31 ENCOUNTER — Other Ambulatory Visit (HOSPITAL_COMMUNITY): Payer: Self-pay

## 2021-05-31 MED ORDER — NIFEDIPINE ER 30 MG PO TB24
30.0000 mg | ORAL_TABLET | Freq: Every day | ORAL | 0 refills | Status: DC
  Filled 2021-05-31: qty 7, 7d supply, fill #0

## 2021-05-31 MED ORDER — IBUPROFEN 600 MG PO TABS: 600.0000 mg | ORAL_TABLET | Freq: Four times a day (QID) | ORAL | 0 refills | Status: DC

## 2021-05-31 MED ORDER — FUROSEMIDE 20 MG PO TABS: 20.0000 mg | ORAL_TABLET | Freq: Every day | ORAL | 0 refills | Status: DC

## 2021-05-31 MED ORDER — FUROSEMIDE 20 MG PO TABS
20.0000 mg | ORAL_TABLET | Freq: Every day | ORAL | 0 refills | Status: DC
  Filled 2021-05-31: qty 3, 3d supply, fill #0

## 2021-05-31 MED ORDER — ACETAMINOPHEN 500 MG PO TABS: 1000.0000 mg | ORAL_TABLET | Freq: Four times a day (QID) | ORAL | 0 refills | Status: DC

## 2021-05-31 NOTE — Lactation Note (Addendum)
This note was copied from a baby's chart. ?Lactation Consultation Note ?Mom asked if baby had a good latch. Mom states it is hurting some. ?Baby cluster fed and nipples are tender but intact. ?Baby appears to have a good latch. LC demonstrated flanging lips and keeping cheeks to breast. Baby BF 10 minutes then falls asleep. Encouraged breast compressions occasionally during feedings for baby to get more during feedings and keep baby stimulated for longer feedings. ?Suggested mom pumps since baby is less than 6 lbs. Mom stated she has her pump and prefers to use her pump. ?Discussed supplementing since mom was breast/formula on admission, mom hasn't supplemented any. Suggested mom pumps and gives back to baby if she pumps anything. Mom stated baby only feeds about 10 minutes. Suggested that isn't long enough to try for longer feedings. At least 15-20 minutes if possible. Baby has good output.  ?Hopefully mom will start pumping soon. ? ?Patient Name: Christina Costa ?Today's Date: 05/31/2021 ?Reason for consult: Mother's request;Early term 37-38.6wks ?Age:64 hours ? ?Maternal Data ?Has patient been taught Hand Expression?: Yes ? ?Feeding ?  ? ?LATCH Score ?Latch: Grasps breast easily, tongue down, lips flanged, rhythmical sucking. ? ?Audible Swallowing: A few with stimulation ? ?Type of Nipple: Everted at rest and after stimulation ? ?Comfort (Breast/Nipple): Filling, red/small blisters or bruises, mild/mod discomfort (sore/intact) ? ?Hold (Positioning): Assistance needed to correctly position infant at breast and maintain latch. ? ?LATCH Score: 7 ? ? ?Lactation Tools Discussed/Used ?  ? ?Interventions ?Interventions: Breast feeding basics reviewed;Adjust position;Support pillows;Breast compression ? ?Discharge ?  ? ?Consult Status ?Consult Status: Follow-up ?Date: 05/31/21 ?Follow-up type: In-patient ? ? ? ?Charyl Dancer ?05/31/2021, 3:11 AM ? ? ? ?

## 2021-05-31 NOTE — Discharge Summary (Signed)
? ?  Postpartum Discharge Summary ? ?  ?Patient Name: Christina Costa ?DOB: [birth year] ?MRN: [id number] ? ?Date of admission: 05/28/2021 ?Delivery date:05/29/2021  ?Delivering provider: LAW, CASSANDRA A  ?Date of discharge: 05/31/2021 ? ?Admitting diagnosis: Encounter for induction of labor [Z34.90]  TOLAC 37.6 wks for Gestational Hypertention  ?Intrauterine pregnancy: [redacted]w[redacted]d    ?Secondary diagnosis:  Principal Problem: ?  Postpartum care following cesarean delivery 4/19 ?Active Problems: ?  Hypothyroidism ?  Encounter for induction of labor ?  Gestational hypertension ?  Gestational thrombocytopenia ?  Failed trial of labor, AOD and fetal intolerance ?  Repeat low transverse cesarean section 4/19 ? ?   ?Discharge diagnosis: Term Pregnancy Delivered and Gestational Hypertension                                              ?Post partum procedures: none ?Augmentation: cervical foley x 2. Then AROM and Pitocin ?Complications: None failed TOLAC  ? ?Hospital course: Induction of Labor With Cesarean Section   ?31y.o. yo [id number] admitted to the hospital 05/28/2021 for induction of labor, desiring TOLAC Patient had a labor course significant for cervical balloon x 2, AROM, pitocin upto 18 mu. She had arrest of dilation with cat II tracing, so patient went for cesarean section due to Arrest of Dilation and Non-Reassuring FHR. Delivery details are as follows: ?Membrane Rupture Time/Date: 4:22 PM ,05/28/2021   ?Delivery Method:C-Section, Low Transverse  ?Details of operation can be found in separate operative Note.  Patient had an uncomplicated postpartum course. She is ambulating, tolerating a regular diet, passing flatus, and urinating well.  Patient is discharged home in stable condition on 05/31/21.     ? ?Newborn Data: ?Birth date:[birth year]/2021  ?Birth time:3:30 AM  ?Gender:Female  ?Living status:Living  ?Apgars:8 ,9  ?Weight:2820 g                               ? ?Magnesium Sulfate received: No ?BMZ received:  No ?Rhophylac:N/A ?MMR:No ?T-DaP: declined ?Flu: declined ?Transfusion:No ? ?Physical exam  ?Vitals:  ? 05/30/21 1342 05/30/21 2019 05/31/21 0540 05/31/21 0936  ?BP: 122/79 129/81 131/78 (!) 141/84  ?Pulse: 70 81 75 85  ?Resp: _0 ?Temp: 97.7 ?F (36.5 ?C) 98 ?F (36.7 ?C) 98.3 ?F (36.8 ?C) 97.9 ?F (36.6 ?C)  ?TempSrc: Oral Oral Oral   ?SpO2: 100% 100% 100% 98%  ?Weight:      ?Height:      ? ?General: alert, cooperative, and no distress ?Lochia: appropriate ?Uterine Fundus: firm ?Incision: Healing well with no significant drainage, No significant erythema, Dressing is clean, dry, and intact ?DVT Evaluation: No evidence of DVT seen on physical exam. ?Negative Homan's sign. ?Labs: ?Lab Results  ?Component Value Date  ? WBC 13.4 (H) 05/29/2021  ? HGB 12.4 05/29/2021  ? HCT 37.1 05/29/2021  ? MCV 86.1 05/29/2021  ? PLT 122 (L) 05/29/2021  ? ? ?  Latest Ref Rng & Units 05/28/2021  ? 12:33 AM  ?CMP  ?Glucose 70 - 99 mg/dL 106    ?BUN 6 - 20 mg/dL 10    ?Creatinine 0.44 - 1.00 mg/dL 0.53    ?Sodium 135 - 145 mmol/L 137    ?Potassium 3.5 - 5.1 mmol/L 3.6    ?Chloride 98 - 111 mmol/L  108    ?CO2 22 - 32 mmol/L 20    ?Calcium 8.9 - 10.3 mg/dL 8.9    ?Total Protein 6.5 - 8.1 g/dL 6.4    ?Total Bilirubin 0.3 - 1.2 mg/dL 0.4    ?Alkaline Phos 38 - 126 U/L 96    ?AST 15 - 41 U/L 20    ?ALT 0 - 44 U/L 18    ? ?Edinburgh Score: ? ?  05/29/2021  ?  5:40 PM  ?Christina Costa Postnatal Depression Scale Screening Tool  ?I have been able to laugh and see the funny side of things. 0  ?I have looked forward with enjoyment to things. 0  ?I have blamed myself unnecessarily when things went wrong. 0  ?I have been anxious or worried for no good reason. 0  ?I have felt scared or panicky for no good reason. 0  ?Things have been getting on top of me. 1  ?I have been so unhappy that I have had difficulty sleeping. 0  ?I have felt sad or miserable. 0  ?I have been so unhappy that I have been crying. 0  ?The thought of harming myself has occurred to  me. 0  ?Edinburgh Postnatal Depression Scale Total 1  ? ? ? ? ?After visit meds:  ?Allergies as of 05/31/2021   ? ?   Reactions  ? Wheat Bran   ? Gluten Meal Nausea And Vomiting  ? ?  ? ?  ?Medication List  ?  ? ?STOP taking these medications   ? ?aspirin EC 81 MG tablet ?  ?labetalol 100 MG tablet ?Commonly known as: NORMODYNE ?  ? ?  ? ?TAKE these medications   ? ?acetaminophen 500 MG tablet ?Commonly known as: TYLENOL ?Take 2 tablets (1,000 mg total) by mouth every 6 (six) hours. ?  ?clobetasol cream 0.05 % ?Commonly known as: TEMOVATE ?Apply 1 application topically as directed. Qd to bid aa eczema on body up to 2 weeks, then d/c, avoid face, groin, axilla ?  ?fluticasone 50 MCG/ACT nasal spray ?Commonly known as: FLONASE ?Place 2 sprays into both nostrils daily. ?  ?furosemide 20 MG tablet ?Commonly known as: LASIX ?Take 1 tablet (20 mg total) by mouth daily. ?Start taking on: June 01, 2021 ?  ?ibuprofen 600 MG tablet ?Commonly known as: ADVIL ?Take 1 tablet (600 mg total) by mouth every 6 (six) hours. ?  ?potassium iodide 1 GM/ML solution ?Commonly known as: SSKI ?Take by mouth once. ?  ?prenatal multivitamin Tabs tablet ?Take 1 tablet by mouth daily at 12 noon. ?  ?promethazine 12.5 MG tablet ?Commonly known as: PHENERGAN ?Take 12.5 mg by mouth every 6 (six) hours as needed for nausea or vomiting. ?  ?Synthroid 100 MCG tablet ?Generic drug: levothyroxine ?Take 100 mcg by mouth daily. ?What changed: Another medication with the same name was removed. Continue taking this medication, and follow the directions you see here. ?  ? ?  ? ? ? ?Discharge home in stable condition ?Infant Feeding: Breast ?Infant Disposition:home with mother ?Discharge instruction: per After Visit Summary and Postpartum booklet. ?Activity: Advance as tolerated. Pelvic rest for 6 weeks.  ?Diet: routine diet ?Anticipated Birth Control: Unsure ?Postpartum Appointment:5 days for BP check in office with Gavin Potters CNM  ?Future Appointments:No  future appointments. Next visit after that at 6 wks  ?Follow up Visit: ? Follow-up Information   ? ? Suzan Nailer, CNM Follow up.   ?Specialty: Certified Nurse Midwife ?Why: See Gavin Potters on 4/27 for  BP check - call office to schedule this visit for BP check ?Contact information: ?[address] ?[place] [address] ?[phone] ? ? ?  ?  ? ?  ?  ? ?  ? ? ? ?  ? ?05/31/2021 ?Elveria Royals, MD ? ? ?

## 2021-05-31 NOTE — Plan of Care (Signed)
Discharge instructions given, pt receptive.  

## 2021-05-31 NOTE — Progress Notes (Deleted)
Subjective: ?Postpartum Day 1: Elective Repeat Cesarean Delivery ?Patient reports nausea and vomiting yesterday 3 hrs after C/s. She received Zofran and felt better, no concerns since. Leg tingling "tremors" per RN note- resolved after removing SCDs for some time. Ob Anesthesia doc saw her as well. Reports bladder retention after last C/s, so now watching closely, has voided once since foley out this morning. Will plan timed voids today.  ?Eating well, pain controlled, ambulating, no LE tingling/ numbness. Lochia minimal, no breast problems .   ? ?Boy- circ planned, mom wants to hold that today since he is still working on good feeds and has had not had his 24 hr checks. Ok to wait and plan w/ on call Ob tomorrow  ? ?Objective: ?Vital signs in last 24 hours: ?Temp:  [97.7 ?F (36.5 ?C)-98.3 ?F (36.8 ?C)] 97.9 ?F (36.6 ?C) (04/21 [id number]) ?Pulse Rate:  [70-85] 85 (04/21 0936) ?Resp:  [16-18] 18 (04/21 0936) ?BP: (122-141)/(78-84) 141/84 (04/21 0936) ?SpO2:  [98 %-100 %] 98 % (04/21 0936) ? ?Physical Exam:  ?General: alert and cooperative ?Lungs CTA bilat ?CV RRR ?Lochia: appropriate ?Uterine Fundus: firm  Abdo soft, active BS  ?Incision: healing well, no significant drainage ?DVT Evaluation: No evidence of DVT seen on physical exam. ?Negative Homan's sign. ? ? ?  Latest Ref Rng & Units 05/29/2021  ?  6:28 AM 05/28/2021  ? 10:25 AM 05/28/2021  ? 12:33 AM  ?CBC  ?WBC 4.0 - 10.5 K/uL 13.4   10.2   8.4    ?Hemoglobin 12.0 - 15.0 g/dL 12.4   13.7   12.8    ?Hematocrit 36.0 - 46.0 % 37.1   40.1   37.6    ?Platelets 150 - 400 K/uL 122   143   141    ?  ? ?Assessment/Plan: ?Status post Cesarean section- repeat #2. POD #1 Doing well today ?POst op care ?PP care  ?Gest thrombocytopenia- no concerns  ?Watch for urinary retention since hx after last C/s  ?Surgical findings of uterine dehiscence and window d/w pt and what is means for next pregnancy 38 wks delivery etc, need to d/w primary Ob Dr Pamala Hurry more. Pt does not plan to have  more kids.  ?Wants early Dc tomorrow if all well for her and baby ? ?Christina Costa ?05/31/2021, 12:32 PM ? ? ?

## 2021-05-31 NOTE — Progress Notes (Signed)
? ?Subjective: ?POD# 2 ?Live born female  ?Birth Weight: 6 lb 3.5 oz (2820 g) ?APGAR: 8, 9 ? ?Newborn Delivery   ?Birth date/time: [birth year]2021 03:30:00 ?Delivery type: C-Section, Low Transverse ?Trial of labor: Yes ?C-section categorization: Repeat ?  ?  ?Baby name: Conley Simmonds ?Delivering provider: LAW, CASSANDRA A  ? ?Feeding: breast ? ?Pain control at delivery: Epidural  ? ?Reports feeling well. ? ?Patient reports tolerating PO.   ?Breast symptoms:none ?Pain controlled with  PO meds ?Denies HA/SOB/C/P/N/V/dizziness. Flatus absent. ?She reports vaginal bleeding as normal, without clots.  She is ambulating, urinating without difficulty.    ? ?Objective: ?VS: BP (!) 141/84 (BP Location: Right Arm)   Pulse 85   Temp 97.9 ?F (36.6 ?C)   Resp 18   Ht 5\' 6"  (1.676 m)   Wt 124.8 kg   LMP 09/04/2020   SpO2 98%   Breastfeeding Unknown   BMI 44.42 kg/m?   ?  ?Vitals:  ? 05/30/21 1342 05/30/21 2019 05/31/21 0540 05/31/21 0936  ?BP: 122/79 129/81 131/78 (!) 141/84  ?Pulse: 70 81 75 85  ?Resp: 16 18 18 18   ?Temp: 97.7 ?F (36.5 ?C) 98 ?F (36.7 ?C) 98.3 ?F (36.8 ?C) 97.9 ?F (36.6 ?C)  ?TempSrc: Oral Oral Oral   ?SpO2: 100% 100% 100% 98%  ?Weight:      ?Height:      ?   ? ? ?  Latest Ref Rng & Units 05/29/2021  ?  6:28 AM 05/28/2021  ? 10:25 AM 05/28/2021  ? 12:33 AM  ?CBC  ?WBC 4.0 - 10.5 K/uL 13.4   10.2   8.4    ?Hemoglobin 12.0 - 15.0 g/dL 05/30/2021   05/30/2021   50.2    ?Hematocrit 36.0 - 46.0 % 37.1   40.1   37.6    ?Platelets 150 - 400 K/uL 122   143   141    ?   ? ? ?  Latest Ref Rng & Units 05/28/2021  ? 12:33 AM 08/22/2019  ?  2:19 PM 12/01/2014  ?  2:13 PM  ?CMP  ?Glucose 70 - 99 mg/dL 10/23/2019   84   97    ?BUN 6 - 20 mg/dL 10   12   9     ?Creatinine 0.44 - 1.00 mg/dL 12/03/2014   786      ?Sodium 135 - 145 mmol/L 137   138   139    ?Potassium 3.5 - 5.1 mmol/L 3.6   4.4   4.1    ?Chloride 98 - 111 mmol/L 108   101   103    ?CO2 22 - 32 mmol/L 20    29    ?Calcium 8.9 - 10.3 mg/dL 8.9   9.8   9.7    ?Total Protein 6.5 - 8.1 g/dL 6.4    7.3   7.5    ?Total Bilirubin 0.3 - 1.2 mg/dL 0.4   0.2   0.3    ?Alkaline Phos 38 - 126 U/L 96   70   53    ?AST 15 - 41 U/L 20   23   18     ?ALT 0 - 44 U/L 18    20    ?  ? ? Blood type: --/--/B POS (04/18 0030) ? Rubella: Immune (02/15 0000)  ?Vaccines: TDaP          declined ?        Flu  declined ?                   COVID-19 declined ? ? Physical Exam:  ?General: alert, cooperative, and no distress ?CV: Regular rate and rhythm ?Resp: clear ?Abdomen: soft, nontender, normal bowel sounds ?Incision: clean, dry, and intact ?Uterine Fundus: firm, below umbilicus, nontender ?Lochia: minimal ?Ext: edema pitting and generalized ? ?Assessment/Plan: ?31 y.o.   POD# 2. [id number]  ?                ?Principal Problem: ?  Postpartum care following cesarean delivery 4/19 - failed VBAC  ?Active Problems: ?  Hypothyroidism ? - stable on synthroid 100 mcg daily ?  Gestational hypertension ? - BP mostly in range, Procardia 30 XL, no neural sx ? - continue to monitor closely, f/up in office in 5 days  ? Dependent edema ? - start furosemide 20 mg daily x 3 ?  Gestational thrombocytopenia ? - no sx of hemorrhage, no evidence of HELLP ?- Discharge home per pt request. POst op care and PP care and warning ss/ d/w pt ?BP check in office w/ Dorisann Frames next wk  ? ? ?  ?

## 2021-06-08 ENCOUNTER — Telehealth (HOSPITAL_COMMUNITY): Payer: Self-pay

## 2021-06-08 NOTE — Telephone Encounter (Signed)
Patient's husband answered phone call and states that it's not a good time to talk right now, that they are on a family outing.  ? ?Will call patient at a later time. ? Christina Costa ?06/08/2021,1007 ?

## 2021-06-10 ENCOUNTER — Telehealth (HOSPITAL_COMMUNITY): Payer: Self-pay | Admitting: *Deleted

## 2021-06-10 NOTE — Telephone Encounter (Signed)
Mom reports feeling good. Incision healing well. No concerns about herself at this time. EPDS=0 [facility] score=1) ?Mom reports baby is doing well. Feeding, peeing, and pooping without difficulty. Mom reports no concerns about baby at present. ? ?Duffy Rhody, RN 06-10-2021 at 9:18am ?

## 2021-10-23 ENCOUNTER — Ambulatory Visit: Payer: BC Managed Care – PPO | Admitting: Dermatology

## 2021-10-23 DIAGNOSIS — L723 Sebaceous cyst: Secondary | ICD-10-CM

## 2021-10-23 DIAGNOSIS — B999 Unspecified infectious disease: Secondary | ICD-10-CM | POA: Diagnosis not present

## 2021-10-23 DIAGNOSIS — L03211 Cellulitis of face: Secondary | ICD-10-CM

## 2021-10-23 MED ORDER — CEPHALEXIN 500 MG PO CAPS: 500.0000 mg | ORAL_CAPSULE | Freq: Three times a day (TID) | ORAL | 0 refills | Status: AC

## 2021-10-23 NOTE — Patient Instructions (Addendum)
Start Cephalexin 500 mg capsule by mouth 3 times daily for 7 days with food.  Recommend ibuprofen 800 mg (4 - 200 mg tablets) every 6-8 hours as needed for pain and inflammation. Stay upright for 15 minutes afterward.  You can also use tylenol as needed for pain control.  If not feeling better within 2 days , please contact us.     Due to recent changes in healthcare laws, you may see results of your pathology and/or laboratory studies on MyChart before the doctors have had a chance to review them. We understand that in some cases there may be results that are confusing or concerning to you. Please understand that not all results are received at the same time and often the doctors may need to interpret multiple results in order to provide you with the best plan of care or course of treatment. Therefore, we ask that you please give [place] 2 business days to thoroughly review all your results before contacting the office for clarification. Should we see a critical lab result, you will be contacted sooner.   If You Need Anything After Your Visit  If you have any questions or concerns for your doctor, please call our main line at [phone] and press option 4 to reach your doctor's medical assistant. If no one answers, please leave a voicemail as directed and we will return your call as soon as possible. Messages left after 4 pm will be answered the following business day.   You may also send [place] a message via MyChart. We typically respond to MyChart messages within 1-2 business days.  For prescription refills, please ask your pharmacy to contact our office. Our fax number is [phone].  If you have an urgent issue when the clinic is closed that cannot wait until the next business day, you can page your doctor at the number below.    Please note that while we do our best to be available for urgent issues outside of office hours, we are not available 24/7.   If you have an urgent issue and are  unable to reach [place], you may choose to seek medical care at your doctor's office, retail clinic, urgent care center, or emergency room.  If you have a medical emergency, please immediately call 911 or go to the emergency department.  Pager Numbers  - Dr. Gwen Pounds: [phone]  - Dr. Neale Burly: [phone]  - Dr. Roseanne Reno: [phone]  In the event of inclement weather, please call our main line at [phone] for an update on the status of any delays or closures.  Dermatology Medication Tips: Please keep the boxes that topical medications come in in order to help keep track of the instructions about where and how to use these. Pharmacies typically print the medication instructions only on the boxes and not directly on the medication tubes.   If your medication is too expensive, please contact our office at [phone] option 4 or send [place] a message through MyChart.   We are unable to tell what your co-pay for medications will be in advance as this is different depending on your insurance coverage. However, we may be able to find a substitute medication at lower cost or fill out paperwork to get insurance to cover a needed medication.   If a prior authorization is required to get your medication covered by your insurance company, please allow [place] 1-2 business days to complete this process.  Drug prices often vary depending on where the prescription is filled and  some pharmacies may offer cheaper prices.  The website www.goodrx.com contains coupons for medications through different pharmacies. The prices here do not account for what the cost may be with help from insurance (it may be cheaper with your insurance), but the website can give you the price if you did not use any insurance.  - You can print the associated coupon and take it with your prescription to the pharmacy.  - You may also stop by our office during regular business hours and pick up a GoodRx coupon card.  - If you need your  prescription sent electronically to a different pharmacy, notify our office through [facility] or by phone at [phone] option 4.     Si Usted Necesita Algo Despus de Su Visita  Tambin puede enviarnos un mensaje a travs de Clinical cytogeneticist. Por lo general respondemos a los mensajes de MyChart en el transcurso de 1 a 2 das hbiles.  Para renovar recetas, por favor pida a su farmacia que se ponga en contacto con nuestra oficina. Annie Sable de fax es [place] [phone].  Si tiene un asunto urgente cuando la clnica est cerrada y que no puede esperar hasta el siguiente da hbil, puede llamar/localizar a su doctor(a) al nmero que aparece a continuacin.   Por favor, tenga en cuenta que aunque hacemos todo lo posible para estar disponibles para asuntos urgentes fuera del horario de [place], no estamos disponibles las 24 horas del da, los 7 [address]  [address] de la [place].   Si tiene un problema urgente y no puede comunicarse con nosotros, puede optar por buscar atencin mdica  en el consultorio de su doctor(a), en una clnica privada, en un centro de atencin urgente o en una sala de emergencias.  Si tiene Engineer, drilling, por favor llame inmediatamente al 911 o vaya a la sala de emergencias.  Nmeros de bper  - Dr. Gwen Pounds: [phone]  - Dra. Moye: [phone]  - Dra. Roseanne Reno: [phone]  En caso de inclemencias del [place], por favor llame a Lacy Duverney principal al [phone] para una actualizacin sobre el [place] de cualquier retraso o cierre.  Consejos para la medicacin en dermatologa: Por favor, guarde las cajas en las que vienen los medicamentos de uso tpico para ayudarle a seguir las instrucciones sobre dnde y cmo usarlos. Las farmacias generalmente imprimen las instrucciones del medicamento slo en las cajas y no directamente en los tubos del [place].   Si su medicamento es muy caro, por favor, pngase en contacto con Rolm Gala llamando al [phone]  y presione la opcin 4 o envenos un mensaje a travs de Clinical cytogeneticist.   No podemos decirle cul ser su copago por los medicamentos por adelantado ya que esto es diferente dependiendo de la cobertura de su seguro. Sin embargo, es posible que podamos encontrar un medicamento sustituto a Audiological scientist un formulario para que el seguro cubra el medicamento que se considera necesario.   Si se requiere una autorizacin previa para que su compaa de seguros [place] su medicamento, por favor permtanos de 1 a 2 das hbiles para completar [address]  Los precios de los medicamentos varan con frecuencia dependiendo del Environmental consultant de dnde se surte la receta y alguna farmacias pueden ofrecer precios ms baratos.  El sitio web www.goodrx.com tiene cupones para medicamentos de Health and safety inspector. Los precios aqu no tienen en cuenta lo que podra costar con la ayuda del seguro (puede ser ms barato con su seguro), pero el sitio web puede darle el precio  si no utiliz [employer].  - Puede imprimir el cupn correspondiente y llevarlo con su receta a la farmacia.  - Tambin puede pasar por nuestra oficina durante el horario de atencin regular y Charity fundraiser una tarjeta de cupones de GoodRx.  - Si necesita que su receta se enve electrnicamente a una farmacia diferente, informe a nuestra oficina a travs de MyChart de [place] o por telfono llamando al [phone] y presione la opcin 4.

## 2021-10-23 NOTE — Progress Notes (Signed)
   Follow-Up Visit   Subjective  Christina Costa is a 31 y.o. female who presents for the following: Skin Problem (Started Saturday night with painful bump at left chin area. Patient reports some times getting under arms. ).  Patient reports she is currently breast feeding.   The following portions of the chart were reviewed this encounter and updated as appropriate:  Tobacco  Allergies  Meds  Problems  Med Hx  Surg Hx  Fam Hx      Review of Systems: No other skin or systemic complaints except as noted in HPI or Assessment and Plan.   Objective  Well appearing patient in no apparent distress; mood and affect are within normal limits.  A focused examination was performed including face. Relevant physical exam findings are noted in the Assessment and Plan.  left jaw Indurated pink subcutaneous nodule warm to touch with surrounding erythema  Left Buccal Cheek Erythema   Assessment & Plan  Inflamed epidermoid cyst of skin left jaw  Culture at left jaw Culture at nose   Start cephalexin tid for 1 week with food   If cyst continues to become problem would recommend surgery to remove     Incision and Drainage - left jaw Location: left jaw  Informed Consent: Discussed risks (permanent scarring, light or dark discoloration, infection, pain, bleeding, bruising, redness, damage to adjacent structures, and recurrence of the lesion) and benefits of the procedure, as well as the alternatives.  Informed consent was obtained.  Preparation: The area was prepped with alcohol.  Anesthesia: Lidocaine 1% with epinephrine.   Procedure Details: An incision was made overlying the lesion. The lesion drained pus, white, chalky cyst material, and blood.  A large amount of fluid was drained.   The lesion was multiloculated.    Antibiotic ointment and a sterile pressure dressing were applied. The patient tolerated procedure well.  Total number of lesions drained: 1  Plan: The patient  was instructed on post-op care. Recommend OTC analgesia as needed for pain.   cephALEXin (KEFLEX) 500 MG capsule - left jaw Take 1 capsule (500 mg total) by mouth 3 (three) times daily for 7 days. Take with food to prevent stomach  upset  Related Procedures Anaerobic and Aerobic Culture Anaerobic and Aerobic Culture  Recurrent infections Right Buccal Cheek  History of recurrent abscesses at different locations  Will swab nares today and consider mupirocin if shows Staph aureus.  Cellulitis of face Left Buccal Cheek  Possible early cellulitis around infected cyst site  Start cephalexin 500 mg 3 times daily for 7 days   Return if symptoms worsen or fail to improve. I, Asher Muir, CMA, am acting as scribe for Darden Dates, MD.  Documentation: I have reviewed the above documentation for accuracy and completeness, and I agree with the above.  Darden Dates, MD

## 2021-10-28 LAB — ANAEROBIC AND AEROBIC CULTURE

## 2021-10-29 ENCOUNTER — Telehealth: Payer: Self-pay

## 2021-10-29 ENCOUNTER — Encounter: Payer: Self-pay | Admitting: Dermatology

## 2021-10-29 ENCOUNTER — Other Ambulatory Visit: Payer: Self-pay | Admitting: Dermatology

## 2021-10-29 NOTE — Telephone Encounter (Addendum)
Discussed results with patient. Patient states area has improved a lot and no swelling. Still healing.  Discussed awaiting results for nasal culture. Discussed treatment with mupirocin bid for 10 days with patient. Patient had questions for provider.  Patient would like to hold off on mupirocin treatment at this time.     ----- Message from Alfonso Patten, MD sent at 10/29/2021  2:09 PM EDT ----- Culture from left jaw grew Staph aureus bacteria. Already treated with I&D plus cephalexin. No additional treatment needed as long as the area is doing well.  Still awaiting result from nasal swab - even if the nasal swab does not show Staph bacteria, would consider treating with mupirocin thin layer (do not want it thick enough to breathe down into lungs) to inside of nose twice a day for 10 days given reported history of repeat abscesses/infected cysts. If she would like to do this, we can go ahead and send that in.  MAs please call. Thank you!

## 2021-11-01 LAB — ANAEROBIC AND AEROBIC CULTURE

## 2022-03-20 LAB — OB RESULTS CONSOLE ABO/RH: RH Type: POSITIVE

## 2022-03-20 LAB — OB RESULTS CONSOLE ANTIBODY SCREEN: Antibody Screen: NEGATIVE

## 2022-05-01 LAB — OB RESULTS CONSOLE RPR: RPR: NONREACTIVE

## 2022-05-01 LAB — OB RESULTS CONSOLE GC/CHLAMYDIA
Chlamydia: NEGATIVE
Neisseria Gonorrhea: NEGATIVE

## 2022-05-01 LAB — OB RESULTS CONSOLE HIV ANTIBODY (ROUTINE TESTING): HIV: NONREACTIVE

## 2022-05-01 LAB — OB RESULTS CONSOLE HEPATITIS B SURFACE ANTIGEN: Hepatitis B Surface Ag: NEGATIVE

## 2022-05-01 LAB — HEPATITIS C ANTIBODY: HCV Ab: NEGATIVE

## 2022-05-01 LAB — OB RESULTS CONSOLE RUBELLA ANTIBODY, IGM: Rubella: IMMUNE

## 2022-05-10 ENCOUNTER — Inpatient Hospital Stay (HOSPITAL_COMMUNITY)
Admission: AD | Admit: 2022-05-10 | Discharge: 2022-05-10 | Disposition: A | Discharge status: Home / Self Care | Payer: BC Managed Care – PPO | Attending: Obstetrics & Gynecology | Admitting: Obstetrics & Gynecology

## 2022-05-10 ENCOUNTER — Encounter (HOSPITAL_COMMUNITY): Payer: Self-pay | Admitting: *Deleted

## 2022-05-10 DIAGNOSIS — Z3A10 10 weeks gestation of pregnancy: Secondary | ICD-10-CM | POA: Diagnosis not present

## 2022-05-10 DIAGNOSIS — O99611 Diseases of the digestive system complicating pregnancy, first trimester: Secondary | ICD-10-CM | POA: Diagnosis not present

## 2022-05-10 DIAGNOSIS — Z3491 Encounter for supervision of normal pregnancy, unspecified, first trimester: Secondary | ICD-10-CM

## 2022-05-10 DIAGNOSIS — K529 Noninfective gastroenteritis and colitis, unspecified: Secondary | ICD-10-CM | POA: Diagnosis present

## 2022-05-10 LAB — CBC WITH DIFFERENTIAL/PLATELET
Abs Immature Granulocytes: 0.04 10*3/uL (ref 0.00–0.07)
Basophils Absolute: 0 10*3/uL (ref 0.0–0.1)
Basophils Relative: 0 %
Eosinophils Absolute: 0 10*3/uL (ref 0.0–0.5)
Eosinophils Relative: 0 %
HCT: 41.6 % (ref 36.0–46.0)
Hemoglobin: 14 g/dL (ref 12.0–15.0)
Immature Granulocytes: 1 %
Lymphocytes Relative: 5 %
Lymphs Abs: 0.4 10*3/uL — ABNORMAL LOW (ref 0.7–4.0)
MCH: 28.1 pg (ref 26.0–34.0)
MCHC: 33.7 g/dL (ref 30.0–36.0)
MCV: 83.4 fL (ref 80.0–100.0)
Monocytes Absolute: 0.2 10*3/uL (ref 0.1–1.0)
Monocytes Relative: 2 %
Neutro Abs: 7.3 10*3/uL (ref 1.7–7.7)
Neutrophils Relative %: 92 %
Platelets: 158 10*3/uL (ref 150–400)
RBC: 4.99 MIL/uL (ref 3.87–5.11)
RDW: 13.6 % (ref 11.5–15.5)
WBC: 7.9 10*3/uL (ref 4.0–10.5)
nRBC: 0 % (ref 0.0–0.2)

## 2022-05-10 LAB — URINALYSIS, ROUTINE W REFLEX MICROSCOPIC
Glucose, UA: NEGATIVE mg/dL
Hgb urine dipstick: NEGATIVE
Ketones, ur: 20 mg/dL — AB
Leukocytes,Ua: NEGATIVE
Nitrite: NEGATIVE
Protein, ur: 30 mg/dL — AB
Specific Gravity, Urine: 1.032 — ABNORMAL HIGH (ref 1.005–1.030)
pH: 5 (ref 5.0–8.0)

## 2022-05-10 LAB — COMPREHENSIVE METABOLIC PANEL
ALT: 26 U/L (ref 0–44)
AST: 21 U/L (ref 15–41)
Albumin: 4 g/dL (ref 3.5–5.0)
Alkaline Phosphatase: 64 U/L (ref 38–126)
Anion gap: 11 (ref 5–15)
BUN: 8 mg/dL (ref 6–20)
CO2: 24 mmol/L (ref 22–32)
Calcium: 9.3 mg/dL (ref 8.9–10.3)
Chloride: 101 mmol/L (ref 98–111)
Creatinine, Ser: 0.74 mg/dL (ref 0.44–1.00)
GFR, Estimated: >60 mL/min (ref >60)
Glucose, Bld: 120 mg/dL — ABNORMAL HIGH (ref 70–99)
Potassium: 4.2 mmol/L (ref 3.5–5.1)
Sodium: 136 mmol/L (ref 135–145)
Total Bilirubin: 0.7 mg/dL (ref 0.3–1.2)
Total Protein: 7.5 g/dL (ref 6.5–8.1)

## 2022-05-10 MED ORDER — PROMETHAZINE HCL 25 MG/ML IJ SOLN
25.0000 mg | Freq: Once | INTRAMUSCULAR | Status: AC
  Administered 2022-05-10: 25 mg via INTRAVENOUS
  Filled 2022-05-10: qty 1

## 2022-05-10 MED ORDER — FAMOTIDINE IN NACL 20-0.9 MG/50ML-% IV SOLN
20.0000 mg | Freq: Once | INTRAVENOUS | Status: AC
Start: 2022-05-10 — End: 2022-05-10
  Administered 2022-05-10: 20 mg via INTRAVENOUS
  Filled 2022-05-10: qty 50

## 2022-05-10 MED ORDER — ONDANSETRON HCL 4 MG/2ML IJ SOLN
4.0000 mg | Freq: Once | INTRAMUSCULAR | Status: AC
Start: 2022-05-10 — End: 2022-05-10
  Administered 2022-05-10: 4 mg via INTRAVENOUS
  Filled 2022-05-10: qty 2

## 2022-05-10 MED ORDER — LACTATED RINGERS IV BOLUS
1000.0000 mL | Freq: Once | INTRAVENOUS | Status: AC
  Administered 2022-05-10: 1000 mL via INTRAVENOUS

## 2022-05-10 MED ORDER — SCOPOLAMINE 1 MG/3DAYS TD PT72
1.0000 | MEDICATED_PATCH | TRANSDERMAL | Status: DC
  Administered 2022-05-10: 1.5 mg via TRANSDERMAL
  Filled 2022-05-10: qty 1

## 2022-05-10 MED ORDER — PROMETHAZINE HCL 25 MG PO TABS: 25.0000 mg | ORAL_TABLET | Freq: Every evening | ORAL | 0 refills | Status: DC | PRN

## 2022-05-10 MED ORDER — SODIUM CHLORIDE 0.9 % IV BOLUS
1000.0000 mL | Freq: Once | INTRAVENOUS | Status: AC
  Administered 2022-05-10: 1000 mL via INTRAVENOUS

## 2022-05-10 NOTE — MAU Provider Note (Addendum)
Chief Complaint:  Nausea and Emesis   HPI   Event Date/Time   First Provider Initiated Contact with Patient 05/10/22 0819     Christina Costa is a 32 y.o. [id number] at [redacted]w[redacted]d who presents to maternity admissions reporting increased nausea/vomiting for the past 12-14hrs. Has had n/v with this pregnancy but her older child and husband were both ill with a stomach bug through the week. Has tried her prescribed meds (reglan and diclegis) but neither are working, she has vomited hourly overnight and cannot keep down fluids. Has tried oral zofran in the past for her pregnancy related n/v but without relief. No other physical complaints, denies vaginal bleeding, LOF or cramping.   Pregnancy Course: Receives care at [facility]  Past Medical History:  Diagnosis Date   Headache    Hypothyroidism    hypothyroidisn    since age 87   Pregnancy induced hypertension    Seasonal allergies    OB History  Gravida Para Term Preterm AB Living  4 2 2   1 2   SAB IAB Ectopic Multiple Live Births  1     0 2    # Outcome Date GA Lbr Len/2nd Weight Sex Delivery Anes PTL Lv  4 Current           3 Term 05/29/21 [redacted]w[redacted]d  6 lb 3.5 oz (2.82 kg) F CS-LTranv EPI  LIV  2 Term 2020     CS-LTranv   LIV  1 SAB            Past Surgical History:  Procedure Laterality Date   CESAREAN SECTION N/A 07/09/2018   Procedure: CESAREAN SECTION;  Surgeon: Tyson Dense, MD;  Location: [facility];  Service: Obstetrics;  Laterality: N/A;  Primary  edc 07/27/18 NKDA need RNFA   CESAREAN SECTION N/A    Phreesia 06/17/2020   CESAREAN SECTION N/A 05/29/2021   Procedure: CESAREAN SECTION;  Surgeon: Armandina Stammer, DO;  Location: [facility];  Service: Obstetrics;  Laterality: N/A;   DILATION AND EVACUATION N/A 09/16/2017   Procedure: DILATATION AND EVACUATION;  Surgeon: Tyson Dense, MD;  Location: [facility];  Service: Gynecology;  Laterality: N/A;   HAND SURGERY  01/2019   permanent retainers     upper and lower     WISDOM TOOTH EXTRACTION     Family History  Problem Relation Age of Onset   Allergy (severe) Mother    Hypothyroidism Mother    Arthritis Maternal Grandmother    Heart disease Maternal Grandfather    Social History   Tobacco Use   Smoking status: Never   Smokeless tobacco: Never  Vaping Use   Vaping Use: Never used  Substance Use Topics   Alcohol use: No   Drug use: No   Allergies  Allergen Reactions   Wheat    Gluten Meal Nausea And Vomiting   Medications Prior to Admission  Medication Sig Dispense Refill Last Dose   aspirin EC 81 MG tablet Take 81 mg by mouth daily. Swallow whole.   05/08/2022   SYNTHROID 100 MCG tablet Take 100 mcg by mouth daily.   05/09/2022   clobetasol cream (TEMOVATE) [id number] % Apply 1 application topically as directed. Qd to bid aa eczema on body up to 2 weeks, then d/c, avoid face, groin, axilla 60 g 1 Unknown   potassium iodide (SSKI) 1 GM/ML solution Take by mouth once.   05/08/2022   Prenatal Vit-Fe Fumarate-FA (PRENATAL MULTIVITAMIN) TABS tablet Take 1 tablet  by mouth daily at 12 noon. 30 tablet 1 05/08/2022   I have reviewed patient's Past Medical Hx, Surgical Hx, Family Hx, Social Hx, medications and allergies.   ROS  Pertinent items noted in HPI and remainder of comprehensive ROS otherwise negative.   PHYSICAL EXAM  Patient Vitals for the past 24 hrs:  BP Temp Temp src Pulse Resp SpO2 Height Weight  05/10/22 1212 120/80 98.2 F (36.8 C) Axillary 85 16 -- -- --  05/10/22 0805 131/80 -- -- 85 -- -- -- --  05/10/22 0744 (!) 141/81 98 F (36.7 C) Oral 96 16 99 % 5\' 6"  (1.676 m) 260 lb 4.8 oz (118.1 kg)   Constitutional: Well-developed, well-nourished female, appears in mild distress from nausea.  Cardiovascular: normal rate & rhythm, warm and well-perfused Respiratory: normal effort, no problems with respiration noted GI: Abd soft, non-tender, non-distended MS: Extremities nontender, no edema, normal ROM Neurologic: Alert and oriented x 4.   GU: no CVA tenderness Pelvic: exam deferred  FHR: 160s visually for both babies   Labs: Results for orders placed or performed during the hospital encounter of 05/10/22 (from the past 24 hour(s))  Urinalysis, Routine w reflex microscopic -Urine, Clean Catch     Status: Abnormal   Collection Time: 05/10/22  7:58 AM  Result Value Ref Range   Color, Urine YELLOW YELLOW   APPearance HAZY (A) CLEAR   Specific Gravity, Urine 1.032 (H) 1.005 - 1.030   pH 5.0 5.0 - 8.0   Glucose, UA NEGATIVE NEGATIVE mg/dL   Hgb urine dipstick NEGATIVE NEGATIVE   Bilirubin Urine SMALL (A) NEGATIVE   Ketones, ur 20 (A) NEGATIVE mg/dL   Protein, ur 30 (A) NEGATIVE mg/dL   Nitrite NEGATIVE NEGATIVE   Leukocytes,Ua NEGATIVE NEGATIVE   RBC / HPF 0-5 0 - 5 RBC/hpf   WBC, UA 0-5 0 - 5 WBC/hpf   Bacteria, UA RARE (A) NONE SEEN   Squamous Epithelial / HPF 0-5 0 - 5 /HPF   Mucus PRESENT   CBC with Differential/Platelet     Status: Abnormal   Collection Time: 05/10/22  8:48 AM  Result Value Ref Range   WBC 7.9 4.0 - 10.5 K/uL   RBC 4.99 3.87 - 5.11 MIL/uL   Hemoglobin 14.0 12.0 - 15.0 g/dL   HCT 41.6 36.0 - 46.0 %   MCV 83.4 80.0 - 100.0 fL   MCH 28.1 26.0 - 34.0 pg   MCHC 33.7 30.0 - 36.0 g/dL   RDW 13.6 11.5 - 15.5 %   Platelets 158 150 - 400 K/uL   nRBC 0.0 0.0 - 0.2 %   Neutrophils Relative % 92 %   Neutro Abs 7.3 1.7 - 7.7 K/uL   Lymphocytes Relative 5 %   Lymphs Abs 0.4 (L) 0.7 - 4.0 K/uL   Monocytes Relative 2 %   Monocytes Absolute 0.2 0.1 - 1.0 K/uL   Eosinophils Relative 0 %   Eosinophils Absolute 0.0 0.0 - 0.5 K/uL   Basophils Relative 0 %   Basophils Absolute 0.0 0.0 - 0.1 K/uL   Immature Granulocytes 1 %   Abs Immature Granulocytes 0.04 0.00 - 0.07 K/uL  Comprehensive metabolic panel     Status: Abnormal   Collection Time: 05/10/22  8:48 AM  Result Value Ref Range   Sodium 136 135 - 145 mmol/L   Potassium 4.2 3.5 - 5.1 mmol/L   Chloride 101 98 - 111 mmol/L   CO2 24 22 - 32 mmol/L    Glucose,  Bld 120 (H) 70 - 99 mg/dL   BUN 8 6 - 20 mg/dL   Creatinine, Ser 0.74 0.44 - 1.00 mg/dL   Calcium 9.3 8.9 - 10.3 mg/dL   Total Protein 7.5 6.5 - 8.1 g/dL   Albumin 4.0 3.5 - 5.0 g/dL   AST 21 15 - 41 U/L   ALT 26 0 - 44 U/L   Alkaline Phosphatase 64 38 - 126 U/L   Total Bilirubin 0.7 0.3 - 1.2 mg/dL   GFR, Estimated >60 >60 mL/min   Anion gap 11 5 - 15   Imaging:  Pt informed that the ultrasound is considered a limited OB ultrasound and is not intended to be a complete ultrasound exam.  Patient also informed that the ultrasound is not being completed with the intent of assessing for fetal or placental anomalies or any pelvic abnormalities.  Explained that the purpose of today's ultrasound is to assess for   fetal heart rates .  Patient acknowledges the purpose of the exam and the limitations of the study.    MDM & MAU COURSE  MDM: Moderate  MAU Course: Orders Placed This Encounter  Procedures   Urinalysis, Routine w reflex microscopic -Urine, Clean Catch   CBC with Differential/Platelet   Comprehensive metabolic panel   Discharge patient   Meds ordered this encounter  Medications   lactated ringers bolus 1,000 mL   ondansetron (ZOFRAN) injection 4 mg   famotidine (PEPCID) IVPB 20 mg premix   scopolamine (TRANSDERM-SCOP) 1 MG/3DAYS 1.5 mg   promethazine (PHENERGAN) 25 mg in sodium chloride 0.9 % 50 mL IVPB   sodium chloride 0.9 % bolus 1,000 mL   promethazine (PHENERGAN) 25 MG tablet    Sig: Take 1 tablet (25 mg total) by mouth at bedtime as needed for nausea or vomiting.    Dispense:  30 tablet    Refill:  0    Order Specific Question:   Supervising Provider    Answer:   Rip Harbour, MICHAEL L [id number]   Initial round of medications: lr bolus, zofran, pepcid and scopolamine Pt able to keep down ice, juice and applesauce but stated they were not "settling well". Explained that we may only be able to get her nausea decreased but not completely relieved if this is a  stomach bug, but offered to try another medication. Pt expressed understanding and agreed to try phenergan.  Phenergan IVPB ordered with a bolus of NS.  Nausea completely relieved by phenergan, no emesis since arrival to MAU. Stable for discharge home.   ASSESSMENT   1. Gastroenteritis   2. Fetal heart tones present, first trimester   3. [redacted] weeks gestation of pregnancy    PLAN  Discharge home in stable condition with return precautions.     Follow-up Information     [facility], [facility] Follow up.   Why: as scheduled for ongoing prenatal care Contact information: [place] [address] [phone]                 Allergies as of 05/10/2022       Reactions   Wheat    Gluten Meal Nausea And Vomiting        Medication List     TAKE these medications    aspirin EC 81 MG tablet Take 81 mg by mouth daily. Swallow whole.   clobetasol cream 0.05 % Commonly known as: TEMOVATE Apply 1 application topically as directed. Qd to bid aa eczema on body up to 2 weeks,  then d/c, avoid face, groin, axilla   potassium iodide 1 GM/ML solution Commonly known as: SSKI Take by mouth once.   prenatal multivitamin Tabs tablet Take 1 tablet by mouth daily at 12 noon.   promethazine 25 MG tablet Commonly known as: PHENERGAN Take 1 tablet (25 mg total) by mouth at bedtime as needed for nausea or vomiting.   Synthroid 100 MCG tablet Generic drug: levothyroxine Take 100 mcg by mouth daily.       Gaylan Gerold, CNM, MSN, [place] Certified Nurse Midwife, [facility]

## 2022-05-10 NOTE — Progress Notes (Signed)
Bedside US performed by Candie Chroman CNM and confirmed two fetal heartbeats were visualized.

## 2022-05-10 NOTE — MAU Note (Signed)
...  Christina Costa is a 32 y.o. at [redacted]w[redacted]d here in MAU reporting: N/V her entire pregnancy but for the past 12 hours she has been vomiting each hour and has not been able to keep anything down. She last took Zofran around 1145 yesterday morning and her diclegis around 1845. She reports she was extremely dizzy last night but that has since stopped but she feels "extremely weak." She reports at this point she is just vomiting bile. Denies VB or LOF. Denies pain currently but reports there have been moments when she is vomiting that she feels lower abdominal cramping.  She reports she would have come earlier but she had to wait for her mother to come and watch her children.  Onset of complaint: Ongoing - worsened over the past two hours Pain score: Denies current pain.  FHT: twin gestation - unable to doppler in triage Lab orders placed from triage:  UA

## 2022-08-11 ENCOUNTER — Other Ambulatory Visit: Payer: Self-pay | Admitting: Certified Nurse Midwife

## 2022-08-11 DIAGNOSIS — O99012 Anemia complicating pregnancy, second trimester: Secondary | ICD-10-CM

## 2022-08-13 ENCOUNTER — Non-Acute Institutional Stay (HOSPITAL_COMMUNITY)
Admission: RE | Admit: 2022-08-13 | Discharge: 2022-08-13 | Disposition: A | Discharge status: Home / Self Care | Payer: BC Managed Care – PPO | Source: Ambulatory Visit | Attending: Internal Medicine | Admitting: Internal Medicine

## 2022-08-13 DIAGNOSIS — O99012 Anemia complicating pregnancy, second trimester: Secondary | ICD-10-CM | POA: Diagnosis not present

## 2022-08-13 DIAGNOSIS — Z3A24 24 weeks gestation of pregnancy: Secondary | ICD-10-CM | POA: Insufficient documentation

## 2022-08-13 DIAGNOSIS — O30042 Twin pregnancy, dichorionic/diamniotic, second trimester: Secondary | ICD-10-CM | POA: Diagnosis not present

## 2022-08-13 MED ORDER — IRON SUCROSE 500 MG IVPB - SIMPLE MED
500.0000 mg | INTRAVENOUS | Status: DC
  Administered 2022-08-13: 500 mg via INTRAVENOUS
  Filled 2022-08-13: qty 500

## 2022-08-13 MED ORDER — SODIUM CHLORIDE 0.9 % IV SOLN
INTRAVENOUS | Status: DC | PRN
Start: 2022-08-13 — End: 2022-08-14

## 2022-08-13 MED ORDER — ACETAMINOPHEN 500 MG PO TABS
500.0000 mg | ORAL_TABLET | ORAL | Status: DC
  Administered 2022-08-13: 500 mg via ORAL
  Filled 2022-08-13: qty 1

## 2022-08-13 MED ORDER — DIPHENHYDRAMINE HCL 25 MG PO CAPS
25.0000 mg | ORAL_CAPSULE | ORAL | Status: DC
  Administered 2022-08-13: 25 mg via ORAL
  Filled 2022-08-13: qty 1

## 2022-08-13 NOTE — Progress Notes (Signed)
PATIENT CARE CENTER NOTE  Diagnosis: Anemia complicating pregnancy in second trimester [O99.012]    Provider: June Leap, CNM   Procedure: Venofer 500 mg (dose #1 of 2)  Note: Patient received Venofer 500 mg via PIV. Pt pre-medicated with PO Benadryl and Tylenol per orders. Pt tolerated infusion with no adverse reaction. AVS printed and given to pt. Pt advised that she should come back for second dose in two weeks, and that she can schedule next appointment at front desk, pt verbalized understanding. Pt is alert, oriented, and ambulatory at discharge.

## 2022-08-20 NOTE — Progress Notes (Signed)
Cardio-Obstetrics Clinic  New Evaluation  Date:  08/22/2022   ID:  Christina Costa, DOB 1990-04-18, MRN [id number]  PCP:  Carlean Jews, NP   [facility] Providers Cardiologist:  None  Electrophysiologist:  None       Referring MD: June Leap, CNM   Chief Complaint: DOE  History of Present Illness:    Christina Costa is a 33 y.o. female [G4P2012] who is being seen today for the evaluation of DOE at the request of June Leap, CNM.   Patient with history of gestational HTN who was referred for DOE during pregnancy.  Patient is currently [redacted]w[redacted]d pregnant with twins. She has been developing progressive shortness of breath with and lightheadedness for the past month. She has been monitoring her heart rates and SpO2 with her watch and her oxygen sats are going to low 90s and HR in low 100s. Symptoms usually occur with exertion and last about before they resolve. When they do occur, she has to sit down, elevated her legs, and rest before symptoms resolve. Never had symptoms with sitting. No syncope.   She is currently on labetalol but it has been cut back to daily.   No orthopnea, LE edema, PND, or palpitations.   Prior CV Studies Reviewed: The following studies were reviewed today: No CV studies  Past Medical History:  Diagnosis Date   Headache    Hypothyroidism    hypothyroidisn    since age 50   Pregnancy induced hypertension    Seasonal allergies     Past Surgical History:  Procedure Laterality Date   CESAREAN SECTION N/A 07/09/2018   Procedure: CESAREAN SECTION;  Surgeon: Ranae Pila, MD;  Location: [facility] LD ORS;  Service: Obstetrics;  Laterality: N/A;  Primary  edc 07/27/18 NKDA need RNFA   CESAREAN SECTION N/A    Phreesia 06/17/2020   CESAREAN SECTION N/A 05/29/2021   Procedure: CESAREAN SECTION;  Surgeon: Toy Baker, DO;  Location: [facility] LD ORS;  Service: Obstetrics;  Laterality: N/A;   DILATION AND EVACUATION N/A 09/16/2017    Procedure: DILATATION AND EVACUATION;  Surgeon: Ranae Pila, MD;  Location: [facility] ORS;  Service: Gynecology;  Laterality: N/A;   HAND SURGERY  01/2019   permanent retainers     upper and lower    WISDOM TOOTH EXTRACTION      OB History     Gravida  4   Para  2   Term  2   Preterm      AB  1   Living  2      SAB  1   IAB      Ectopic      Multiple  0   Live Births  2            Current Medications: Current Meds  Medication Sig   aspirin EC 81 MG tablet Take 81 mg by mouth daily. Swallow whole.   potassium iodide (SSKI) 1 GM/ML solution Take by mouth once.   Prenatal Vit-Fe Fumarate-FA (PRENATAL MULTIVITAMIN) TABS tablet Take 1 tablet by mouth daily at 12 noon.   promethazine (PHENERGAN) 25 MG tablet Take 1 tablet (25 mg total) by mouth at bedtime as needed for nausea or vomiting.   SYNTHROID 100 MCG tablet Take 100 mcg by mouth daily.     Allergies:   Wheat and Gluten meal   Social History   Socioeconomic History   Marital status: Married    Spouse name: Not  on file   Number of children: Not on file   Years of education: Not on file   Highest education level: Not on file  Occupational History   Not on file  Tobacco Use   Smoking status: Never   Smokeless tobacco: Never  Vaping Use   Vaping status: Never Used  Substance and Sexual Activity   Alcohol use: Not Currently   Drug use: Never   Sexual activity: Not Currently    Birth control/protection: None  Other Topics Concern   Not on file  Social History Narrative   Not on file   Social Determinants of Health   Financial Resource Strain: Low Risk  (07/07/2018)   Overall Financial Resource Strain (CARDIA)    Difficulty of Paying Living Expenses: Not hard at all  Food Insecurity: No Food Insecurity (07/07/2018)   Hunger Vital Sign    Worried About Running Out of Food in the Last Year: Never true    Ran Out of Food in the Last Year: Never true  Transportation Needs: Unknown  (07/07/2018)   PRAPARE - Administrator, Civil Service (Medical): No    Lack of Transportation (Non-Medical): Not on file  Physical Activity: Not on file  Stress: No Stress Concern Present (07/07/2018)   [employer] of Occupational Health - Occupational Stress Questionnaire    Feeling of Stress : Only a little  Social Connections: Not on file    Family History  Problem Relation Age of Onset   Allergy (severe) Mother    Hypothyroidism Mother    Arthritis Maternal Grandmother    Heart disease Maternal Grandfather     ROS:   Please see the history of present illness.     All other systems reviewed and are negative.   Labs/EKG Reviewed:    EKG:   EKG is  ordered today.  The ekg ordered today demonstrates NSR with HR 96bpm  Recent Labs: 05/10/2022: ALT 26; BUN 8; Creatinine, Ser 0.74; Hemoglobin 14.0; Platelets 158; Potassium 4.2; Sodium 136   Recent Lipid Panel Lab Results  Component Value Date/Time   CHOL 206 (H) 06/26/2020 08:38 AM   TRIG 87 06/26/2020 08:38 AM   HDL 48 06/26/2020 08:38 AM   CHOLHDL 4.3 06/26/2020 08:38 AM   CHOLHDL 4 05/15/2011 12:17 PM   LDLCALC 142 (H) 06/26/2020 08:38 AM    Physical Exam:    VS:  BP 134/80   Pulse 88   Ht 5\' 6"  (1.676 m)   Wt 279 lb (126.6 kg)   LMP 09/04/2020   SpO2 99%   BMI 45.03 kg/m     Wt Readings from Last 3 Encounters:  08/22/22 279 lb (126.6 kg)  05/10/22 260 lb 4.8 oz (118.1 kg)  05/28/21 275 lb 3.2 oz (124.8 kg)     GEN:  Well nourished, well developed in no acute distress HEENT: Normal NECK: No JVD; No carotid bruits CARDIAC: RRR, no murmurs, rubs, gallops RESPIRATORY:  Clear to auscultation without rales, wheezing or rhonchi  ABDOMEN: Gravid MUSCULOSKELETAL:  No edema; No deformity  SKIN: Warm and dry NEUROLOGIC:  Alert and oriented x 3 PSYCHIATRIC:  Normal affect    Risk Assessment/Risk Calculators:     ASSESSMENT & PLAN:    #Suspected Orthostasis in Pregnancy: #DOE: #Iron  Deficiency: -Patient with episodes of severe lightheadedness and dyspnea while standing or with minimal exertion in the setting of twin gestation and iron deficiency most concerning for orthostasis in pregnancy with significant venous compression and poor  venous blood return -Symptoms also worsen with heat exposure and improve with laying down and elevating her legs  -She is currently on IV iron supplementation which will likely help with symptoms (especially DOE) -Unfortunately, she likely will continue to have orthostatic symptoms as she gets bigger with her pregnancy -Discussed conservative measures like hydration, not skipping meals, belly bands, leg elevation to help minimize symptoms -She is inquiring about going out on disability which I will allow the OBGYN team to decide -Will also check TTE to ensure no structural disease leading to her symptoms  Patient Instructions  Medication Instructions:   Your physician recommends that you continue on your current medications as directed. Please refer to the Current Medication list given to you today.  *If you need a refill on your cardiac medications before your next appointment, please call your pharmacy*   Testing/Procedures:  Your physician has requested that you have an OB echocardiogram. Echocardiography is a painless test that uses sound waves to create images of your heart. It provides your doctor with information about the size and shape of your heart and how well your heart's chambers and valves are working. This procedure takes approximately one hour. There are no restrictions for this procedure. Cardiac-OB patient: to be performed by [place] or [place].   Please do NOT wear cologne, perfume, aftershave, or lotions (deodorant is allowed). Please arrive 15 minutes prior to your appointment time.    Follow-Up:  AS NEEDED WITH DR. KARDIE TOBB     Dispo:  No follow-ups on file.   Medication Adjustments/Labs and Tests  Ordered: Current medicines are reviewed at length with the patient today.  Concerns regarding medicines are outlined above.  Tests Ordered: Orders Placed This Encounter  Procedures   EKG 12-Lead   ECHOCARDIOGRAM COMPLETE   Medication Changes: No orders of the defined types were placed in this encounter.

## 2022-08-22 ENCOUNTER — Ambulatory Visit: Payer: BC Managed Care – PPO | Admitting: Cardiology

## 2022-08-22 ENCOUNTER — Encounter: Payer: Self-pay | Admitting: Cardiology

## 2022-08-22 ENCOUNTER — Encounter: Payer: Self-pay | Admitting: *Deleted

## 2022-08-22 VITALS — BP 134/80 | HR 88 | Ht 66.0 in | Wt 279.0 lb

## 2022-08-22 DIAGNOSIS — R55 Syncope and collapse: Secondary | ICD-10-CM

## 2022-08-22 DIAGNOSIS — E611 Iron deficiency: Secondary | ICD-10-CM | POA: Diagnosis not present

## 2022-08-22 DIAGNOSIS — R42 Dizziness and giddiness: Secondary | ICD-10-CM | POA: Diagnosis not present

## 2022-08-22 DIAGNOSIS — R0609 Other forms of dyspnea: Secondary | ICD-10-CM

## 2022-08-22 NOTE — Patient Instructions (Signed)
Medication Instructions:   Your physician recommends that you continue on your current medications as directed. Please refer to the Current Medication list given to you today.  *If you need a refill on your cardiac medications before your next appointment, please call your pharmacy*   Testing/Procedures:  Your physician has requested that you have an OB echocardiogram. Echocardiography is a painless test that uses sound waves to create images of your heart. It provides your doctor with information about the size and shape of your heart and how well your heart's chambers and valves are working. This procedure takes approximately one hour. There are no restrictions for this procedure. Cardiac-OB patient: to be performed by [place] or [place].   Please do NOT wear cologne, perfume, aftershave, or lotions (deodorant is allowed). Please arrive 15 minutes prior to your appointment time.    Follow-Up:  AS NEEDED WITH DR. KARDIE TOBB

## 2022-08-27 ENCOUNTER — Non-Acute Institutional Stay (HOSPITAL_COMMUNITY)
Admission: RE | Admit: 2022-08-27 | Discharge: 2022-08-27 | Disposition: A | Discharge status: Home / Self Care | Payer: BC Managed Care – PPO | Source: Ambulatory Visit | Attending: Internal Medicine | Admitting: Internal Medicine

## 2022-08-27 DIAGNOSIS — D649 Anemia, unspecified: Secondary | ICD-10-CM | POA: Diagnosis not present

## 2022-08-27 DIAGNOSIS — Z3A00 Weeks of gestation of pregnancy not specified: Secondary | ICD-10-CM | POA: Insufficient documentation

## 2022-08-27 DIAGNOSIS — O99012 Anemia complicating pregnancy, second trimester: Secondary | ICD-10-CM | POA: Diagnosis not present

## 2022-08-27 MED ORDER — IRON SUCROSE 500 MG IVPB - SIMPLE MED
500.0000 mg | Freq: Once | INTRAVENOUS | Status: AC
  Administered 2022-08-27: 500 mg via INTRAVENOUS
  Filled 2022-08-27: qty 275

## 2022-08-27 MED ORDER — ACETAMINOPHEN 500 MG PO TABS
500.0000 mg | ORAL_TABLET | Freq: Once | ORAL | Status: AC
  Administered 2022-08-27: 500 mg via ORAL
  Filled 2022-08-27: qty 1

## 2022-08-27 MED ORDER — SODIUM CHLORIDE 0.9 % IV SOLN: INTRAVENOUS | Status: DC | PRN

## 2022-08-27 MED ORDER — DIPHENHYDRAMINE HCL 25 MG PO CAPS
25.0000 mg | ORAL_CAPSULE | Freq: Once | ORAL | Status: AC
  Administered 2022-08-27: 25 mg via ORAL
  Filled 2022-08-27: qty 1

## 2022-08-27 NOTE — Progress Notes (Addendum)
PATIENT CARE CENTER NOTE  Diagnosis: Anemia complicating pregnancy in second trimester [O99.012]    Provider: June Leap, CNM   Procedure: Venofer 500 mg (dose #2 of 2)   Note: Patient received Venofer 500 mg infusion via PIV. Pt pre-medicated with Po Tylenol and Benadryl per orders. Tolerated infusion well with no adverse reaction. Vital signs stable. Discharge instructions given. Pt is alert, oriented, and ambulatory at discharge.

## 2022-09-18 ENCOUNTER — Ambulatory Visit (HOSPITAL_COMMUNITY): Payer: BC Managed Care – PPO | Attending: Cardiology

## 2022-09-18 DIAGNOSIS — R42 Dizziness and giddiness: Secondary | ICD-10-CM | POA: Diagnosis present

## 2022-09-18 DIAGNOSIS — R55 Syncope and collapse: Secondary | ICD-10-CM | POA: Diagnosis not present

## 2022-09-18 LAB — ECHOCARDIOGRAM COMPLETE
AR max vel: 2.19 cm2
AV Area VTI: 2.32 cm2
AV Area mean vel: 2.29 cm2
AV Mean grad: 8.3 mmHg
AV Peak grad: 15.1 mmHg
Ao pk vel: 1.94 m/s
Area-P 1/2: 4.15 cm2
S' Lateral: 2.4 cm

## 2022-10-09 ENCOUNTER — Encounter (HOSPITAL_COMMUNITY): Payer: Self-pay | Admitting: Obstetrics & Gynecology

## 2022-10-09 ENCOUNTER — Inpatient Hospital Stay (HOSPITAL_COMMUNITY)
Admission: AD | Admit: 2022-10-09 | Discharge: 2022-10-09 | Disposition: A | Discharge status: Home / Self Care | Payer: BC Managed Care – PPO | Attending: Obstetrics & Gynecology | Admitting: Obstetrics & Gynecology

## 2022-10-09 DIAGNOSIS — Z3A32 32 weeks gestation of pregnancy: Secondary | ICD-10-CM | POA: Diagnosis not present

## 2022-10-09 DIAGNOSIS — O30043 Twin pregnancy, dichorionic/diamniotic, third trimester: Secondary | ICD-10-CM | POA: Diagnosis not present

## 2022-10-09 DIAGNOSIS — O34219 Maternal care for unspecified type scar from previous cesarean delivery: Secondary | ICD-10-CM | POA: Insufficient documentation

## 2022-10-09 DIAGNOSIS — O321XX1 Maternal care for breech presentation, fetus 1: Secondary | ICD-10-CM | POA: Insufficient documentation

## 2022-10-09 DIAGNOSIS — O133 Gestational [pregnancy-induced] hypertension without significant proteinuria, third trimester: Secondary | ICD-10-CM | POA: Diagnosis not present

## 2022-10-09 DIAGNOSIS — N858 Other specified noninflammatory disorders of uterus: Secondary | ICD-10-CM | POA: Insufficient documentation

## 2022-10-09 DIAGNOSIS — O322XX2 Maternal care for transverse and oblique lie, fetus 2: Secondary | ICD-10-CM | POA: Insufficient documentation

## 2022-10-09 DIAGNOSIS — R030 Elevated blood-pressure reading, without diagnosis of hypertension: Secondary | ICD-10-CM | POA: Diagnosis not present

## 2022-10-09 DIAGNOSIS — I10 Essential (primary) hypertension: Secondary | ICD-10-CM | POA: Diagnosis present

## 2022-10-09 LAB — COMPREHENSIVE METABOLIC PANEL
ALT: 17 U/L (ref 0–44)
AST: 25 U/L (ref 15–41)
Albumin: 3.1 g/dL — ABNORMAL LOW (ref 3.5–5.0)
Alkaline Phosphatase: 128 U/L — ABNORMAL HIGH (ref 38–126)
Anion gap: 8 (ref 5–15)
BUN: 6 mg/dL (ref 6–20)
CO2: 22 mmol/L (ref 22–32)
Calcium: 9 mg/dL (ref 8.9–10.3)
Chloride: 105 mmol/L (ref 98–111)
Creatinine, Ser: 0.66 mg/dL (ref 0.44–1.00)
GFR, Estimated: >60 mL/min (ref >60)
Glucose, Bld: 100 mg/dL — ABNORMAL HIGH (ref 70–99)
Potassium: 4.8 mmol/L (ref 3.5–5.1)
Sodium: 135 mmol/L (ref 135–145)
Total Bilirubin: 0.8 mg/dL (ref 0.3–1.2)
Total Protein: 6.5 g/dL (ref 6.5–8.1)

## 2022-10-09 LAB — CBC
HCT: 36.7 % (ref 36.0–46.0)
Hemoglobin: 12.1 g/dL (ref 12.0–15.0)
MCH: 27.9 pg (ref 26.0–34.0)
MCHC: 33 g/dL (ref 30.0–36.0)
MCV: 84.8 fL (ref 80.0–100.0)
Platelets: 131 10*3/uL — ABNORMAL LOW (ref 150–400)
RBC: 4.33 MIL/uL (ref 3.87–5.11)
RDW: 14.6 % (ref 11.5–15.5)
WBC: 8.2 10*3/uL (ref 4.0–10.5)
nRBC: 0 % (ref 0.0–0.2)

## 2022-10-09 LAB — PROTEIN / CREATININE RATIO, URINE
Creatinine, Urine: 78 mg/dL
Protein Creatinine Ratio: 0.17 mg/mg{Cre} — ABNORMAL HIGH (ref 0.00–0.15)
Total Protein, Urine: 13 mg/dL

## 2022-10-09 NOTE — MAU Note (Signed)
Christina Costa is a 32 y.o. at [redacted]w[redacted]d here in MAU reporting: GHTN, on Labetalol TID, last dose at 1200. Sent over for further eval. Denies HA, visual changes, epigastric pain, does report increase in swelling.  No bleeding or leaking.  Reports +FM x2  Onset of complaint: today Pain score: just usual pregnancy discomfort Vitals:   10/09/22 1434  BP: 138/81  Pulse: 84  Resp: 18  Temp: 98.4 F (36.9 C)  SpO2: 100%     FHT:A 145 (left breech). B 138 (right transverse) Lab orders placed from triage:

## 2022-10-09 NOTE — Progress Notes (Signed)
Carloyn Jaeger, CNM @ bedside with ultrasound to assist finding FHT.  RN hasn't been able to determine which fetus is tracing and hasn't been successful with tracing either twin well.

## 2022-10-09 NOTE — MAU Provider Note (Addendum)
History     CSN: [id number]  Arrival date and time: 10/09/22 1424   Event Date/Time   First Provider Initiated Contact with Patient 10/09/22 1525      Chief Complaint  Patient presents with   Hypertension   HPI Ms. Christina Costa is a 32 y.o. year old G25P2012 female at [redacted]w[redacted]d weeks gestation who was sent to MAU from her OB office for [facility] w/u. She reports her BP was 138/98 in the office. She has a dx of gHTN and takes Labetalol 100 mg TID; she's had 2 doses today. She has been taking Labetalol since 18 weeks. She reports she has noticed increased swelling in her legs over the past 2 days. She states, "I noticed my sandals have been fitting snugly for 2 days." She denies any PEC symptoms. Her pregnancy is complicated by Di-Di twins, h/o gHTN, C/S x 2 and SAB with D&C. She receives [facility] with [facility]; next appt is next week. Her mother is present and contributing to the history taking.   OB History     Gravida  4   Para  2   Term  2   Preterm      AB  1   Living  2      SAB  1   IAB      Ectopic      Multiple  0   Live Births  2           Past Medical History:  Diagnosis Date   Headache    Hypothyroidism    hypothyroidisn    since age 4   Pregnancy induced hypertension    Seasonal allergies     Past Surgical History:  Procedure Laterality Date   CESAREAN SECTION N/A 07/09/2018   Procedure: CESAREAN SECTION;  Surgeon: Ranae Pila, MD;  Location: [facility] LD ORS;  Service: Obstetrics;  Laterality: N/A;  Primary  edc 07/27/18 NKDA need RNFA   CESAREAN SECTION N/A    [employer]reesia 06/17/2020   CESAREAN SECTION N/A 05/29/2021   Procedure: CESAREAN SECTION;  Surgeon: Toy Baker, DO;  Location: [facility] LD ORS;  Service: Obstetrics;  Laterality: N/A;   DILATION AND EVACUATION N/A 09/16/2017   Procedure: DILATATION AND EVACUATION;  Surgeon: Ranae Pila, MD;  Location: [facility] ORS;  Service: Gynecology;  Laterality: N/A;   HAND SURGERY  01/2019    permanent retainers     upper and lower    WISDOM TOOTH EXTRACTION      Family History  Problem Relation Age of Onset   Allergy (severe) Mother    Hypothyroidism Mother    Arthritis Maternal Grandmother    Heart disease Maternal Grandfather     Social History   Tobacco Use   Smoking status: Never   Smokeless tobacco: Never  Vaping Use   Vaping status: Never Used  Substance Use Topics   Alcohol use: Not Currently   Drug use: Never    Allergies:  Allergies  Allergen Reactions   Wheat    Gluten Meal Nausea And Vomiting    Medications Prior to Admission  Medication Sig Dispense Refill Last Dose   aspirin EC 81 MG tablet Take 81 mg by mouth daily. Swallow whole.   10/08/2022   labetalol (NORMODYNE) 100 MG tablet Take 100 mg by mouth 3 (three) times daily.   10/09/2022 at 1200   potassium iodide (SSKI) 1 GM/ML solution Take by mouth once.   10/08/2022   Prenatal Vit-Fe Fumarate-FA (PRENATAL MULTIVITAMIN)  TABS tablet Take 1 tablet by mouth daily at 12 noon. 30 tablet 1 10/08/2022   promethazine (PHENERGAN) 25 MG tablet Take 1 tablet (25 mg total) by mouth at bedtime as needed for nausea or vomiting. 30 tablet 0 10/08/2022   SYNTHROID 100 MCG tablet Take 100 mcg by mouth daily.   10/09/2022    Review of Systems  Constitutional: Negative.   HENT: Negative.    Eyes: Negative.   Respiratory: Negative.    Cardiovascular:  Positive for leg swelling.  Gastrointestinal: Negative.   Endocrine: Negative.   Genitourinary: Negative.   Musculoskeletal: Negative.   Skin: Negative.   Allergic/Immunologic: Negative.   Neurological: Negative.   Hematological: Negative.   Psychiatric/Behavioral: Negative.     Physical Exam  Patient Vitals for the past 24 hrs:  BP Temp Temp src Pulse Resp SpO2 Height Weight  10/09/22 1616 125/81 -- -- 85 -- -- -- --  10/09/22 1600 122/74 -- -- 81 -- 98 % -- --  10/09/22 1546 128/71 -- -- 79 -- -- -- --  10/09/22 1530 132/78 -- -- 82 -- 97 % -- --   10/09/22 1502 137/83 -- -- 80 -- -- -- --  10/09/22 1434 138/81 98.4 F (36.9 C) Oral 84 18 100 % 5\' 6"  (1.676 m) 130.9 kg    Physical Exam Vitals and nursing note reviewed.  Constitutional:      Appearance: Normal appearance. She is obese.  HENT:     Head: Normocephalic and atraumatic.  Cardiovascular:     Rate and Rhythm: Normal rate.  Pulmonary:     Effort: Pulmonary effort is normal.  Abdominal:     Palpations: Abdomen is soft.  Musculoskeletal:        General: Normal range of motion.  Skin:    General: Skin is warm and dry.  Neurological:     Mental Status: She is alert and oriented to person, place, and time.  Psychiatric:        Mood and Affect: Mood normal.        Behavior: Behavior normal.        Thought Content: Thought content normal.        Judgment: Judgment normal.    REACTIVE NST - FHR (A): 130 bpm / moderate variability / accels present / decels absent / FHR (B): 135 bpm / moderate variability / accels present / decels absent /TOCO: 3 UCs noted  MAU Course  Procedures Patient informed that the ultrasound is considered a limited OB ultrasound and is not intended to be a complete ultrasound exam.  Patient also informed that the ultrasound is not being completed with the intent of assessing for fetal or placental anomalies or any pelvic abnormalities.  Explained that the purpose of today's ultrasound is to assess for  location of cardiac activity for placement of cardio monitor .  Baby A was found to be in a breech presentation and Baby B was found in a transverse presentation. Patient acknowledges the purpose of the exam and the limitations of the study.  MDM CCUA CBC CMP P/C Ratio Serial BP's   Results for orders placed or performed during the hospital encounter of 10/09/22 (from the past 24 hour(s))  Protein / creatinine ratio, urine     Status: Abnormal   Collection Time: 10/09/22  2:59 PM  Result Value Ref Range   Creatinine, Urine 78 mg/dL   Total  Protein, Urine 13 mg/dL   Protein Creatinine Ratio 0.17 (H) 0.00 - 0.15 mg/mg[Cre]  CBC     Status: Abnormal   Collection Time: 10/09/22  3:09 PM  Result Value Ref Range   WBC 8.2 4.0 - 10.5 K/uL   RBC 4.33 3.87 - 5.11 MIL/uL   Hemoglobin 12.1 12.0 - 15.0 g/dL   HCT 16.1 09.6 - 04.5 %   MCV 84.8 80.0 - 100.0 fL   MCH 27.9 26.0 - 34.0 pg   MCHC 33.0 30.0 - 36.0 g/dL   RDW 40.9 81.1 - 91.4 %   Platelets 131 (L) 150 - 400 K/uL   nRBC 0.0 0.0 - 0.2 %  Comprehensive metabolic panel     Status: Abnormal   Collection Time: 10/09/22  3:09 PM  Result Value Ref Range   Sodium 135 135 - 145 mmol/L   Potassium 4.8 3.5 - 5.1 mmol/L   Chloride 105 98 - 111 mmol/L   CO2 22 22 - 32 mmol/L   Glucose, Bld 100 (H) 70 - 99 mg/dL   BUN 6 6 - 20 mg/dL   Creatinine, Ser 7.82 0.44 - 1.00 mg/dL   Calcium 9.0 8.9 - 95.6 mg/dL   Total Protein 6.5 6.5 - 8.1 g/dL   Albumin 3.1 (L) 3.5 - 5.0 g/dL   AST 25 15 - 41 U/L   ALT 17 0 - 44 U/L   Alkaline Phosphatase 128 (H) 38 - 126 U/L   Total Bilirubin 0.8 0.3 - 1.2 mg/dL   GFR, Estimated >21 >30 mL/min   Anion gap 8 5 - 15      Assessment and Plan  1. Elevated blood pressure reading in office without diagnosis of hypertension - Continue Labetalol as previously prescribed  2. Gestational hypertension, third trimester - Continue Labetalol as previously prescribed  3. [redacted] weeks gestation of pregnancy   - Discharge patient - Keep scheduled appt with [facility] next week - Patient verbalized an understanding of the plan of care and agrees.   Raelyn Mora, CNM 10/09/2022, 3:26 PM

## 2022-10-20 ENCOUNTER — Other Ambulatory Visit: Payer: Self-pay | Admitting: Obstetrics & Gynecology

## 2022-10-26 ENCOUNTER — Inpatient Hospital Stay (HOSPITAL_COMMUNITY): Payer: BC Managed Care – PPO

## 2022-10-26 ENCOUNTER — Encounter (HOSPITAL_COMMUNITY): Payer: Self-pay | Admitting: Obstetrics and Gynecology

## 2022-10-26 ENCOUNTER — Other Ambulatory Visit: Payer: Self-pay

## 2022-10-26 ENCOUNTER — Inpatient Hospital Stay (HOSPITAL_COMMUNITY)
Admission: AD | Admit: 2022-10-26 | Discharge: 2022-10-26 | Disposition: A | Discharge status: Home / Self Care | Payer: BC Managed Care – PPO | Attending: Obstetrics and Gynecology | Admitting: Obstetrics and Gynecology

## 2022-10-26 DIAGNOSIS — O368131 Decreased fetal movements, third trimester, fetus 1: Secondary | ICD-10-CM

## 2022-10-26 DIAGNOSIS — O30043 Twin pregnancy, dichorionic/diamniotic, third trimester: Secondary | ICD-10-CM

## 2022-10-26 DIAGNOSIS — Z3A35 35 weeks gestation of pregnancy: Secondary | ICD-10-CM

## 2022-10-26 DIAGNOSIS — O368132 Decreased fetal movements, third trimester, fetus 2: Secondary | ICD-10-CM | POA: Diagnosis not present

## 2022-10-26 DIAGNOSIS — O26899 Other specified pregnancy related conditions, unspecified trimester: Secondary | ICD-10-CM

## 2022-10-26 DIAGNOSIS — O26893 Other specified pregnancy related conditions, third trimester: Secondary | ICD-10-CM | POA: Diagnosis not present

## 2022-10-26 DIAGNOSIS — R109 Unspecified abdominal pain: Secondary | ICD-10-CM | POA: Insufficient documentation

## 2022-10-26 LAB — COMPREHENSIVE METABOLIC PANEL
ALT: 17 U/L (ref 0–44)
AST: 19 U/L (ref 15–41)
Albumin: 3.1 g/dL — ABNORMAL LOW (ref 3.5–5.0)
Alkaline Phosphatase: 130 U/L — ABNORMAL HIGH (ref 38–126)
Anion gap: 9 (ref 5–15)
BUN: 6 mg/dL (ref 6–20)
CO2: 21 mmol/L — ABNORMAL LOW (ref 22–32)
Calcium: 9.3 mg/dL (ref 8.9–10.3)
Chloride: 105 mmol/L (ref 98–111)
Creatinine, Ser: 0.52 mg/dL (ref 0.44–1.00)
GFR, Estimated: >60 mL/min (ref >60)
Glucose, Bld: 95 mg/dL (ref 70–99)
Potassium: 4.1 mmol/L (ref 3.5–5.1)
Sodium: 135 mmol/L (ref 135–145)
Total Bilirubin: 0.4 mg/dL (ref 0.3–1.2)
Total Protein: 6.2 g/dL — ABNORMAL LOW (ref 6.5–8.1)

## 2022-10-26 LAB — CBC
HCT: 37.4 % (ref 36.0–46.0)
Hemoglobin: 12.7 g/dL (ref 12.0–15.0)
MCH: 29.3 pg (ref 26.0–34.0)
MCHC: 34 g/dL (ref 30.0–36.0)
MCV: 86.4 fL (ref 80.0–100.0)
Platelets: 117 10*3/uL — ABNORMAL LOW (ref 150–400)
RBC: 4.33 MIL/uL (ref 3.87–5.11)
RDW: 14.6 % (ref 11.5–15.5)
WBC: 8.7 10*3/uL (ref 4.0–10.5)
nRBC: 0 % (ref 0.0–0.2)

## 2022-10-26 LAB — URINALYSIS, ROUTINE W REFLEX MICROSCOPIC
Bilirubin Urine: NEGATIVE
Glucose, UA: NEGATIVE mg/dL
Hgb urine dipstick: NEGATIVE
Ketones, ur: NEGATIVE mg/dL
Leukocytes,Ua: NEGATIVE
Nitrite: NEGATIVE
Protein, ur: NEGATIVE mg/dL
Specific Gravity, Urine: 1.02 (ref 1.005–1.030)
pH: 6.5 (ref 5.0–8.0)

## 2022-10-26 LAB — PROTEIN / CREATININE RATIO, URINE
Creatinine, Urine: 112 mg/dL
Protein Creatinine Ratio: 0.14 mg/mg{creat} (ref 0.00–0.15)
Total Protein, Urine: 16 mg/dL

## 2022-10-26 NOTE — MAU Provider Note (Signed)
Chief Complaint:  Decreased Fetal Movement, Cramping, and Abdominal Pain  HPI: Christina Costa is a 32 y.o. [id number] at [redacted]w[redacted]d who presents to maternity admissions reporting decreased fetal movement and abdominal cramping. Reports low back pain starting yesterday with irregular cramping. Denies leaking of fluid, vaginal bleeding, or contractions. Husband, Brain, present and supportive.  Past Medical History: Past Medical History:  Diagnosis Date   Headache    Hypothyroidism    hypothyroidisn    since age 4   Pregnancy induced hypertension    Seasonal allergies    Past obstetric history: OB History  Gravida Para Term Preterm AB Living  4 2 2   1 2   SAB IAB Ectopic Multiple Live Births  1     0 2    # Outcome Date GA Lbr Len/2nd Weight Sex Type Anes PTL Lv  4 Current           3 Term 05/29/21 [redacted]w[redacted]d  2820 g F CS-LTranv EPI  LIV  2 Term 2020     CS-LTranv   LIV  1 SAB            Past Surgical History: Past Surgical History:  Procedure Laterality Date   CESAREAN SECTION N/A 07/09/2018   Procedure: CESAREAN SECTION;  Surgeon: Ranae Pila, MD;  Location: [facility] LD ORS;  Service: Obstetrics;  Laterality: N/A;  Primary  edc 07/27/18 NKDA need RNFA   CESAREAN SECTION N/A    [employer] 06/17/2020   CESAREAN SECTION N/A 05/29/2021   Procedure: CESAREAN SECTION;  Surgeon: Toy Baker, DO;  Location: [facility] LD ORS;  Service: Obstetrics;  Laterality: N/A;   DILATION AND EVACUATION N/A 09/16/2017   Procedure: DILATATION AND EVACUATION;  Surgeon: Ranae Pila, MD;  Location: [facility] ORS;  Service: Gynecology;  Laterality: N/A;   HAND SURGERY  01/2019   permanent retainers     upper and lower    WISDOM TOOTH EXTRACTION     Family History: Family History  Problem Relation Age of Onset   Allergy (severe) Mother    Hypothyroidism Mother    Arthritis Maternal Grandmother    Heart disease Maternal Grandfather    Social History: Social History   Tobacco Use   Smoking status: Never    Smokeless tobacco: Never  Vaping Use   Vaping status: Never Used  Substance Use Topics   Alcohol use: Not Currently   Drug use: Never   Allergies:  Allergies  Allergen Reactions   Wheat    Gluten Meal Nausea And Vomiting   Meds:  Medications Prior to Admission  Medication Sig Dispense Refill Last Dose   aspirin EC 81 MG tablet Take 81 mg by mouth daily. Swallow whole.   10/25/2022   labetalol (NORMODYNE) 100 MG tablet Take 100 mg by mouth 3 (three) times daily.   10/26/2022   Prenatal Vit-Fe Fumarate-FA (PRENATAL MULTIVITAMIN) TABS tablet Take 1 tablet by mouth daily at 12 noon. 30 tablet 1 10/25/2022   promethazine (PHENERGAN) 25 MG tablet Take 1 tablet (25 mg total) by mouth at bedtime as needed for nausea or vomiting. 30 tablet 0 10/25/2022   SYNTHROID 100 MCG tablet Take 100 mcg by mouth daily.   10/26/2022   potassium iodide (SSKI) 1 GM/ML solution Take by mouth once.   Unknown   I have reviewed patient's Past Medical Hx, Surgical Hx, Family Hx, Social Hx, medications and allergies.   ROS:  Review of Systems Other systems negative  Physical Exam  Patient Vitals for  the past 24 hrs:  BP Temp Temp src Pulse Resp SpO2 Height Weight  10/26/22 1416 (!) 143/87 -- -- 83 -- -- -- --  10/26/22 1400 (!) 148/96 -- -- 85 -- -- -- --  10/26/22 1337 (!) 139/99 -- -- 90 -- -- -- --  10/26/22 1245 (!) 144/99 -- -- 87 -- -- -- --  10/26/22 1231 (!) 140/86 -- -- 86 -- -- -- --  10/26/22 1149 (!) 147/96 97.8 F (36.6 C) Oral 90 18 100 % -- --  10/26/22 1143 -- -- -- -- -- -- 5\' 6"  (1.676 m) 132.3 kg   Constitutional: Well-developed, well-nourished female in no acute distress.  Cardiovascular: normal rate and rhythm Respiratory: normal effort, clear to auscultation bilaterally GI: Abd soft, non-tender, gravid appropriate for gestational age.   No rebound or guarding. MS: Extremities nontender, no edema, normal ROM Neurologic: Alert and oriented x 4.  GU: Neg CVAT.  Dilation:  Fingertip Exam by:: AYetta Barre CNM  FHT A: Baseline 120 , moderate variability, accelerations present, no decelerations FHT B: Baseline 140 , moderate variability, accelerations present, no decelerations Contractions: occasional   Labs: Results for orders placed or performed during the hospital encounter of 10/26/22 (from the past 24 hour(s))  Urinalysis, Routine w reflex microscopic -Urine, Clean Catch     Status: None   Collection Time: 10/26/22 12:21 PM  Result Value Ref Range   Color, Urine YELLOW YELLOW   APPearance CLEAR CLEAR   Specific Gravity, Urine 1.020 1.005 - 1.030   pH 6.5 5.0 - 8.0   Glucose, UA NEGATIVE NEGATIVE mg/dL   Hgb urine dipstick NEGATIVE NEGATIVE   Bilirubin Urine NEGATIVE NEGATIVE   Ketones, ur NEGATIVE NEGATIVE mg/dL   Protein, ur NEGATIVE NEGATIVE mg/dL   Nitrite NEGATIVE NEGATIVE   Leukocytes,Ua NEGATIVE NEGATIVE  Protein / creatinine ratio, urine     Status: None   Collection Time: 10/26/22 12:21 PM  Result Value Ref Range   Creatinine, Urine 112 mg/dL   Total Protein, Urine 16 mg/dL   Protein Creatinine Ratio 0.14 0.00 - 0.15 mg/mg[Cre]  CBC     Status: Abnormal   Collection Time: 10/26/22 12:28 PM  Result Value Ref Range   WBC 8.7 4.0 - 10.5 K/uL   RBC 4.33 3.87 - 5.11 MIL/uL   Hemoglobin 12.7 12.0 - 15.0 g/dL   HCT 47.8 29.5 - 62.1 %   MCV 86.4 80.0 - 100.0 fL   MCH 29.3 26.0 - 34.0 pg   MCHC 34.0 30.0 - 36.0 g/dL   RDW 30.8 65.7 - 84.6 %   Platelets 117 (L) 150 - 400 K/uL   nRBC 0.0 0.0 - 0.2 %  Comprehensive metabolic panel     Status: Abnormal   Collection Time: 10/26/22 12:28 PM  Result Value Ref Range   Sodium 135 135 - 145 mmol/L   Potassium 4.1 3.5 - 5.1 mmol/L   Chloride 105 98 - 111 mmol/L   CO2 21 (L) 22 - 32 mmol/L   Glucose, Bld 95 70 - 99 mg/dL   BUN 6 6 - 20 mg/dL   Creatinine, Ser 9.62 0.44 - 1.00 mg/dL   Calcium 9.3 8.9 - 95.2 mg/dL   Total Protein 6.2 (L) 6.5 - 8.1 g/dL   Albumin 3.1 (L) 3.5 - 5.0 g/dL   AST 19 15  - 41 U/L   ALT 17 0 - 44 U/L   Alkaline Phosphatase 130 (H) 38 - 126 U/L   Total Bilirubin 0.4  0.3 - 1.2 mg/dL   GFR, Estimated >16 >10 mL/min   Anion gap 9 5 - 15   Imaging:  BPP 8/8 x 2 per preliminary report  MAU Course/MDM: I have ordered labs and reviewed results.  NST reviewed, reactive x 2, BPP 8/8 x 2, reports feeling fetal movement x 2 SVE FT/high, occasional contractions, patient reports feeling mild cramping BPs mild range, PEC labs WNL Will increase Labetalol to 200mg  BID and have patient F/U in the office in 2 days. Consult with Dr. Billy Coast to discuss exam findings and test results.   Assessment: 1. Abdominal pain affecting pregnancy    Plan: Discharge home Discussed labor precautions and fetal kick counts Extensively reviewed PEC precautions.  Follow up in office in 2 days   Follow-up Information     Shea Evans, MD. Schedule an appointment as soon as possible for a visit on 10/28/2022.   Specialty: Obstetrics and Gynecology Contact information: [address] [place] [address] [phone]                Pt stable at time of discharge.  June Leap 10/26/2022, 2:48 PM

## 2022-10-26 NOTE — MAU Note (Signed)
Christina Costa is a 32 y.o. at [redacted]w[redacted]d here in MAU reporting: decreased fetal movement in Twin A, states moves when she does things to get movement.  Reports unsure if Twin B is moving because lack of movement in Twin A is more noticeable. Denies VB or LOF.  Endorses abdominal cramping. LMP: NA Onset of complaint: today Pain score: 6 Vitals:   10/26/22 1149  BP: (!) 147/96  Pulse: 90  Resp: 18  Temp: 97.8 F (36.6 C)  SpO2: 100%     YSA:[id number] until in room Lab orders placed from triage:   UA

## 2022-10-27 NOTE — Patient Instructions (Signed)
Christina Costa  10/27/2022   Your procedure is scheduled on:  11/09/2022  Arrive at 0530 at Entrance C on [address] at [facility]  and [employer]. You are invited to use the FREE valet parking or use the Visitor's parking deck.  Pick up the phone at the desk and dial [phone].  Call this number if you have problems the morning of surgery: [phone]  Remember:   Do not eat food:(After Midnight) Desps de medianoche.  Do not drink clear liquids: (After Midnight) Desps de medianoche.  Take these medicines the morning of surgery with A SIP OF WATER:  Labetalol and synthroid   Do not wear jewelry, make-up or nail polish.  Do not wear lotions, powders, or perfumes. Do not wear deodorant.  Do not shave 48 hours prior to surgery.  Do not bring valuables to the hospital.  [facility] is not   responsible for any belongings or valuables brought to the hospital.  Contacts, dentures or bridgework may not be worn into surgery.  Leave suitcase in the car. After surgery it may be brought to your room.  For patients admitted to the hospital, checkout time is 11:00 AM the day of              discharge.      Please read over the following fact sheets that you were given:     Preparing for Surgery

## 2022-10-29 ENCOUNTER — Encounter (HOSPITAL_COMMUNITY): Payer: Self-pay

## 2022-10-29 ENCOUNTER — Telehealth (HOSPITAL_COMMUNITY): Payer: Self-pay | Admitting: *Deleted

## 2022-10-29 NOTE — Telephone Encounter (Signed)
Preadmission screen  

## 2022-10-30 ENCOUNTER — Inpatient Hospital Stay (HOSPITAL_COMMUNITY): Payer: BC Managed Care – PPO | Admitting: Anesthesiology

## 2022-10-30 ENCOUNTER — Encounter (HOSPITAL_COMMUNITY)
Admission: AD | Disposition: A | Discharge status: Home / Self Care | Payer: Self-pay | Source: Home / Self Care | Attending: Obstetrics

## 2022-10-30 ENCOUNTER — Inpatient Hospital Stay (HOSPITAL_COMMUNITY)
Admission: AD | Admit: 2022-10-30 | Discharge: 2022-11-04 | DRG: 784 | Disposition: A | Discharge status: Home / Self Care | Payer: BC Managed Care – PPO | Attending: Obstetrics | Admitting: Obstetrics

## 2022-10-30 ENCOUNTER — Encounter (HOSPITAL_COMMUNITY): Payer: Self-pay

## 2022-10-30 ENCOUNTER — Other Ambulatory Visit: Payer: Self-pay

## 2022-10-30 ENCOUNTER — Encounter (HOSPITAL_COMMUNITY): Payer: Self-pay | Admitting: Obstetrics

## 2022-10-30 DIAGNOSIS — Z3A35 35 weeks gestation of pregnancy: Secondary | ICD-10-CM

## 2022-10-30 DIAGNOSIS — O99284 Endocrine, nutritional and metabolic diseases complicating childbirth: Secondary | ICD-10-CM | POA: Diagnosis present

## 2022-10-30 DIAGNOSIS — D62 Acute posthemorrhagic anemia: Secondary | ICD-10-CM | POA: Diagnosis not present

## 2022-10-30 DIAGNOSIS — M549 Dorsalgia, unspecified: Secondary | ICD-10-CM | POA: Diagnosis not present

## 2022-10-30 DIAGNOSIS — D696 Thrombocytopenia, unspecified: Secondary | ICD-10-CM | POA: Diagnosis present

## 2022-10-30 DIAGNOSIS — O99214 Obesity complicating childbirth: Secondary | ICD-10-CM | POA: Diagnosis present

## 2022-10-30 DIAGNOSIS — O9081 Anemia of the puerperium: Secondary | ICD-10-CM | POA: Diagnosis not present

## 2022-10-30 DIAGNOSIS — R1010 Upper abdominal pain, unspecified: Secondary | ICD-10-CM | POA: Diagnosis not present

## 2022-10-30 DIAGNOSIS — O34211 Maternal care for low transverse scar from previous cesarean delivery: Secondary | ICD-10-CM | POA: Diagnosis present

## 2022-10-30 DIAGNOSIS — E039 Hypothyroidism, unspecified: Secondary | ICD-10-CM | POA: Diagnosis present

## 2022-10-30 DIAGNOSIS — O9912 Other diseases of the blood and blood-forming organs and certain disorders involving the immune mechanism complicating childbirth: Secondary | ICD-10-CM | POA: Diagnosis present

## 2022-10-30 DIAGNOSIS — O321XX1 Maternal care for breech presentation, fetus 1: Secondary | ICD-10-CM | POA: Diagnosis present

## 2022-10-30 DIAGNOSIS — O99113 Other diseases of the blood and blood-forming organs and certain disorders involving the immune mechanism complicating pregnancy, third trimester: Secondary | ICD-10-CM

## 2022-10-30 DIAGNOSIS — O1414 Severe pre-eclampsia complicating childbirth: Principal | ICD-10-CM | POA: Diagnosis present

## 2022-10-30 DIAGNOSIS — O139 Gestational [pregnancy-induced] hypertension without significant proteinuria, unspecified trimester: Secondary | ICD-10-CM | POA: Diagnosis present

## 2022-10-30 DIAGNOSIS — O133 Gestational [pregnancy-induced] hypertension without significant proteinuria, third trimester: Secondary | ICD-10-CM

## 2022-10-30 DIAGNOSIS — O30043 Twin pregnancy, dichorionic/diamniotic, third trimester: Secondary | ICD-10-CM | POA: Diagnosis present

## 2022-10-30 DIAGNOSIS — R030 Elevated blood-pressure reading, without diagnosis of hypertension: Secondary | ICD-10-CM | POA: Diagnosis present

## 2022-10-30 DIAGNOSIS — Z302 Encounter for sterilization: Secondary | ICD-10-CM

## 2022-10-30 DIAGNOSIS — O9962 Diseases of the digestive system complicating childbirth: Secondary | ICD-10-CM | POA: Diagnosis present

## 2022-10-30 DIAGNOSIS — K219 Gastro-esophageal reflux disease without esophagitis: Secondary | ICD-10-CM | POA: Diagnosis present

## 2022-10-30 DIAGNOSIS — O99893 Other specified diseases and conditions complicating puerperium: Secondary | ICD-10-CM | POA: Diagnosis not present

## 2022-10-30 DIAGNOSIS — O1413 Severe pre-eclampsia, third trimester: Principal | ICD-10-CM | POA: Diagnosis present

## 2022-10-30 DIAGNOSIS — O99119 Other diseases of the blood and blood-forming organs and certain disorders involving the immune mechanism complicating pregnancy, unspecified trimester: Secondary | ICD-10-CM | POA: Diagnosis present

## 2022-10-30 DIAGNOSIS — Z98891 History of uterine scar from previous surgery: Secondary | ICD-10-CM

## 2022-10-30 LAB — COMPREHENSIVE METABOLIC PANEL
ALT: 20 U/L (ref 0–44)
AST: 20 U/L (ref 15–41)
Albumin: 3.2 g/dL — ABNORMAL LOW (ref 3.5–5.0)
Alkaline Phosphatase: 158 U/L — ABNORMAL HIGH (ref 38–126)
Anion gap: 13 (ref 5–15)
BUN: 7 mg/dL (ref 6–20)
CO2: 22 mmol/L (ref 22–32)
Calcium: 9.4 mg/dL (ref 8.9–10.3)
Chloride: 103 mmol/L (ref 98–111)
Creatinine, Ser: 0.61 mg/dL (ref 0.44–1.00)
GFR, Estimated: >60 mL/min (ref >60)
Glucose, Bld: 77 mg/dL (ref 70–99)
Potassium: 4.5 mmol/L (ref 3.5–5.1)
Sodium: 138 mmol/L (ref 135–145)
Total Bilirubin: 0.3 mg/dL (ref 0.3–1.2)
Total Protein: 6.4 g/dL — ABNORMAL LOW (ref 6.5–8.1)

## 2022-10-30 LAB — CBC
HCT: 37.8 % (ref 36.0–46.0)
HCT: 40.3 % (ref 36.0–46.0)
Hemoglobin: 12.3 g/dL (ref 12.0–15.0)
Hemoglobin: 13 g/dL (ref 12.0–15.0)
MCH: 27.6 pg (ref 26.0–34.0)
MCH: 27.9 pg (ref 26.0–34.0)
MCHC: 32.5 g/dL (ref 30.0–36.0)
MCHC: 32.3 g/dL (ref 30.0–36.0)
MCV: 84.9 fL (ref 80.0–100.0)
MCV: 86.5 fL (ref 80.0–100.0)
Platelets: 117 10*3/uL — ABNORMAL LOW (ref 150–400)
Platelets: 123 10*3/uL — ABNORMAL LOW (ref 150–400)
RBC: 4.45 MIL/uL (ref 3.87–5.11)
RBC: 4.66 MIL/uL (ref 3.87–5.11)
RDW: 14.5 % (ref 11.5–15.5)
RDW: 14.7 % (ref 11.5–15.5)
WBC: 8.6 10*3/uL (ref 4.0–10.5)
WBC: 9.8 10*3/uL (ref 4.0–10.5)
nRBC: 0 % (ref 0.0–0.2)
nRBC: 0 % (ref 0.0–0.2)

## 2022-10-30 LAB — PROTEIN / CREATININE RATIO, URINE
Creatinine, Urine: 92 mg/dL
Protein Creatinine Ratio: 0.14 mg/mg{Cre} (ref 0.00–0.15)
Total Protein, Urine: 13 mg/dL

## 2022-10-30 LAB — PREPARE RBC (CROSSMATCH)

## 2022-10-30 LAB — ABO/RH: ABO/RH(D): B POS

## 2022-10-30 SURGERY — Surgical Case
Anesthesia: Spinal | Laterality: Bilateral

## 2022-10-30 MED ORDER — FENTANYL CITRATE (PF) 100 MCG/2ML IJ SOLN
INTRAMUSCULAR | Status: AC
  Filled 2022-10-30: qty 2

## 2022-10-30 MED ORDER — HYDRALAZINE HCL 20 MG/ML IJ SOLN: 10.0000 mg | INTRAMUSCULAR | Status: DC | PRN

## 2022-10-30 MED ORDER — DIPHENHYDRAMINE HCL 50 MG/ML IJ SOLN: 12.5000 mg | INTRAMUSCULAR | Status: DC | PRN

## 2022-10-30 MED ORDER — LABETALOL HCL 5 MG/ML IV SOLN
INTRAVENOUS | Status: AC
  Administered 2022-10-30: 20 mg via INTRAVENOUS
  Filled 2022-10-30: qty 4

## 2022-10-30 MED ORDER — MORPHINE SULFATE (PF) 0.5 MG/ML IJ SOLN
INTRAMUSCULAR | Status: DC | PRN
  Administered 2022-10-30: 150 ug via INTRATHECAL

## 2022-10-30 MED ORDER — LABETALOL HCL 5 MG/ML IV SOLN: 20.0000 mg | INTRAVENOUS | Status: DC | PRN

## 2022-10-30 MED ORDER — PHENYLEPHRINE HCL-NACL 20-0.9 MG/250ML-% IV SOLN
INTRAVENOUS | Status: AC
  Filled 2022-10-30: qty 250

## 2022-10-30 MED ORDER — BUPIVACAINE IN DEXTROSE 0.75-8.25 % IT SOLN
INTRATHECAL | Status: AC
  Filled 2022-10-30: qty 2

## 2022-10-30 MED ORDER — LIDOCAINE-EPINEPHRINE (PF) 2 %-1:200000 IJ SOLN
INTRAMUSCULAR | Status: DC | PRN
  Administered 2022-10-30: 2 mL via EPIDURAL

## 2022-10-30 MED ORDER — LACTATED RINGERS IV SOLN: INTRAVENOUS | Status: DC

## 2022-10-30 MED ORDER — STERILE WATER FOR IRRIGATION IR SOLN
Status: DC | PRN
  Administered 2022-10-30: 1000 mL

## 2022-10-30 MED ORDER — MEPERIDINE HCL 25 MG/ML IJ SOLN: 6.2500 mg | INTRAMUSCULAR | Status: DC | PRN

## 2022-10-30 MED ORDER — ONDANSETRON HCL 4 MG/2ML IJ SOLN: 4.0000 mg | Freq: Once | INTRAMUSCULAR | Status: DC | PRN

## 2022-10-30 MED ORDER — ACETAMINOPHEN 500 MG PO TABS: 1000.0000 mg | ORAL_TABLET | Freq: Once | ORAL | Status: DC

## 2022-10-30 MED ORDER — PHENYLEPHRINE HCL-NACL 20-0.9 MG/250ML-% IV SOLN
INTRAVENOUS | Status: DC | PRN
  Administered 2022-10-30: 30 ug/min via INTRAVENOUS

## 2022-10-30 MED ORDER — ACETAMINOPHEN 10 MG/ML IV SOLN
INTRAVENOUS | Status: DC | PRN
Start: 2022-10-30 — End: 2022-10-30
  Administered 2022-10-30: 1000 mg via INTRAVENOUS

## 2022-10-30 MED ORDER — SODIUM CHLORIDE 0.9 % IR SOLN
Status: DC | PRN
  Administered 2022-10-30: 1

## 2022-10-30 MED ORDER — MAGNESIUM OXIDE -MG SUPPLEMENT 400 (240 MG) MG PO TABS: 400.0000 mg | ORAL_TABLET | Freq: Once | ORAL | Status: DC

## 2022-10-30 MED ORDER — ONDANSETRON HCL 4 MG/2ML IJ SOLN: 4.0000 mg | Freq: Three times a day (TID) | INTRAMUSCULAR | Status: DC | PRN

## 2022-10-30 MED ORDER — MAGNESIUM SULFATE 40 GM/1000ML IV SOLN
2.0000 g/h | INTRAVENOUS | Status: AC
  Administered 2022-10-30 – 2022-10-31 (×2): 2 g/h via INTRAVENOUS
  Filled 2022-10-30 (×2): qty 1000

## 2022-10-30 MED ORDER — OXYTOCIN-SODIUM CHLORIDE 30-0.9 UT/500ML-% IV SOLN
INTRAVENOUS | Status: DC | PRN
  Administered 2022-10-30: 300 mL via INTRAVENOUS
  Administered 2022-10-30: 30 [IU] via INTRAVENOUS

## 2022-10-30 MED ORDER — FENTANYL CITRATE (PF) 100 MCG/2ML IJ SOLN: 25.0000 ug | INTRAMUSCULAR | Status: DC | PRN

## 2022-10-30 MED ORDER — MORPHINE SULFATE (PF) 0.5 MG/ML IJ SOLN
INTRAMUSCULAR | Status: AC
  Filled 2022-10-30: qty 10

## 2022-10-30 MED ORDER — SODIUM CHLORIDE 0.9% FLUSH: 3.0000 mL | INTRAVENOUS | Status: DC | PRN

## 2022-10-30 MED ORDER — SODIUM CHLORIDE 0.9 % IV SOLN: INTRAVENOUS | Status: DC

## 2022-10-30 MED ORDER — DIPHENHYDRAMINE HCL 25 MG PO CAPS
25.0000 mg | ORAL_CAPSULE | ORAL | Status: DC | PRN
  Filled 2022-10-30: qty 1

## 2022-10-30 MED ORDER — MAGNESIUM SULFATE BOLUS VIA INFUSION
4.0000 g | Freq: Once | INTRAVENOUS | Status: AC
  Administered 2022-10-30: 4 g via INTRAVENOUS
  Filled 2022-10-30: qty 1000

## 2022-10-30 MED ORDER — DEXAMETHASONE SODIUM PHOSPHATE 10 MG/ML IJ SOLN
INTRAMUSCULAR | Status: DC | PRN
  Administered 2022-10-30: 10 mg via INTRAVENOUS

## 2022-10-30 MED ORDER — LABETALOL HCL 5 MG/ML IV SOLN
20.0000 mg | Freq: Once | INTRAVENOUS | Status: DC
Start: 2022-10-30 — End: 2022-10-30

## 2022-10-30 MED ORDER — LABETALOL HCL 5 MG/ML IV SOLN: 40.0000 mg | INTRAVENOUS | Status: DC | PRN

## 2022-10-30 MED ORDER — SOD CITRATE-CITRIC ACID 500-334 MG/5ML PO SOLN
30.0000 mL | Freq: Once | ORAL | Status: AC
  Administered 2022-10-30: 30 mL via ORAL
  Filled 2022-10-30: qty 30

## 2022-10-30 MED ORDER — ONDANSETRON HCL 4 MG/2ML IJ SOLN
INTRAMUSCULAR | Status: AC
  Filled 2022-10-30: qty 2

## 2022-10-30 MED ORDER — OXYTOCIN-SODIUM CHLORIDE 30-0.9 UT/500ML-% IV SOLN
INTRAVENOUS | Status: AC
  Filled 2022-10-30: qty 500

## 2022-10-30 MED ORDER — BUPIVACAINE IN DEXTROSE 0.75-8.25 % IT SOLN
INTRATHECAL | Status: DC | PRN
  Administered 2022-10-30: 1.9 mL via INTRATHECAL

## 2022-10-30 MED ORDER — LABETALOL HCL 5 MG/ML IV SOLN: 80.0000 mg | INTRAVENOUS | Status: DC | PRN

## 2022-10-30 MED ORDER — NALOXONE HCL 0.4 MG/ML IJ SOLN: 0.4000 mg | INTRAMUSCULAR | Status: DC | PRN

## 2022-10-30 MED ORDER — CEFAZOLIN IN SODIUM CHLORIDE 3-0.9 GM/100ML-% IV SOLN
3.0000 g | INTRAVENOUS | Status: AC
  Administered 2022-10-30: 3 g via INTRAVENOUS

## 2022-10-30 MED ORDER — POVIDONE-IODINE 10 % EX SWAB: 2.0000 | Freq: Once | CUTANEOUS | Status: DC

## 2022-10-30 MED ORDER — NALOXONE HCL 4 MG/10ML IJ SOLN: 1.0000 ug/kg/h | INTRAVENOUS | Status: DC | PRN

## 2022-10-30 MED ORDER — FENTANYL CITRATE (PF) 100 MCG/2ML IJ SOLN
INTRAMUSCULAR | Status: DC | PRN
  Administered 2022-10-30: 15 ug via INTRATHECAL

## 2022-10-30 MED ORDER — ONDANSETRON HCL 4 MG/2ML IJ SOLN
INTRAMUSCULAR | Status: DC | PRN
  Administered 2022-10-30: 4 mg via INTRAVENOUS

## 2022-10-30 SURGICAL SUPPLY — 30 items
APL SKNCLS STERI-STRIP NONHPOA (GAUZE/BANDAGES/DRESSINGS) ×1
BENZOIN TINCTURE PRP APPL 2/3 (GAUZE/BANDAGES/DRESSINGS) IMPLANT
CLAMP UMBILICAL CORD (MISCELLANEOUS) IMPLANT
DRSG OPSITE POSTOP 4X10 (GAUZE/BANDAGES/DRESSINGS) IMPLANT
ELECT REM PT RETURN 9FT ADLT (ELECTROSURGICAL) ×1
ELECTRODE REM PT RTRN 9FT ADLT (ELECTROSURGICAL) IMPLANT
GAUZE PAD ABD 7.5X8 STRL (GAUZE/BANDAGES/DRESSINGS) IMPLANT
GAUZE SPONGE 4X4 12PLY STRL (GAUZE/BANDAGES/DRESSINGS) IMPLANT
GLOVE BIO SURGEON STRL SZ7 (GLOVE) IMPLANT
GLOVE BIOGEL PI IND STRL 7.0 (GLOVE) IMPLANT
GOWN STRL REUS W/TWL LRG LVL3 (GOWN DISPOSABLE) IMPLANT
LIGASURE IMPACT 36 18CM CVD LR (INSTRUMENTS) IMPLANT
MAT PREVALON FULL STRYKER (MISCELLANEOUS) IMPLANT
NDL HYPO 25X5/8 SAFETYGLIDE (NEEDLE) IMPLANT
NEEDLE HYPO 25X5/8 SAFETYGLIDE (NEEDLE) ×2 IMPLANT
NS IRRIG 1000ML POUR BTL (IV SOLUTION) IMPLANT
PACK C SECTION WH (CUSTOM PROCEDURE TRAY) IMPLANT
PAD OB MATERNITY 4.3X12.25 (PERSONAL CARE ITEMS) IMPLANT
RETRACTOR TRAXI PANNICULUS (MISCELLANEOUS) IMPLANT
STRIP CLOSURE SKIN 1/2X4 (GAUZE/BANDAGES/DRESSINGS) IMPLANT
SUT PLAIN 2 0 (SUTURE) ×1
SUT PLAIN ABS 2-0 CT1 27XMFL (SUTURE) IMPLANT
SUT VIC AB 0 CT1 27 (SUTURE) ×2
SUT VIC AB 0 CT1 27XBRD ANBCTR (SUTURE) IMPLANT
SUT VIC AB 0 CTX 36 (SUTURE) ×2
SUT VIC AB 0 CTX36XBRD ANBCTRL (SUTURE) IMPLANT
SUT VIC AB 4-0 KS 27 (SUTURE) IMPLANT
TOWEL OR 17X24 6PK STRL BLUE (TOWEL DISPOSABLE) IMPLANT
TRAY FOLEY W/BAG SLVR 14FR LF (SET/KITS/TRAYS/PACK) IMPLANT
WATER STERILE IRR 1000ML POUR (IV SOLUTION) IMPLANT

## 2022-10-30 NOTE — H&P (Signed)
L&D Admission  S: Patient is a 32yo [id number] at [redacted]w[redacted]d with DCDA twins, hypothyroidism, gestational versus chronic HTN on labetalol 200mg  BID, h/o CS x2, gestational thrombocytopenia presenting to MAU from office for ongoing headache and elevated BP's. Patient reports has had frontal headache for 4 days, improves but does not go away with Tylenol. The headache is present currently. She also reports blurry vision and spots in her vision for the last week, present now. She reports her BP's have been increasing at home for last week. In office today, BP was 146/104. She denies RUQ pain, chest pain, shortness of breath, or new/worsening lower extremity edema. She reports some intermittent contractions, felt in her back. Good fetal movement. Denies vaginal bleeding or leakage of fluid.   O:  Vitals:   10/30/22 1918 10/30/22 1930 10/30/22 2012 10/30/22 2014  BP: (!) 160/112 (!) 166/113 136/73 136/73  Pulse: 97 (!) 101 88 85  Resp:   20 20  Temp:   98.9 F (37.2 C) 98.9 F (37.2 C)  TempSrc:   Oral Oral  SpO2: 100%   98%  Weight:      Height:        Physical exam: GA: well appearing, NAD Chest: CTAB, RRR Abd: soft, non-tender, gravid Ext: 1+ non-pitting edema to mid-shins  Labs:  Lab Results  Component Value Date   WBC 8.6 10/30/2022   HGB 12.3 10/30/2022   HCT 37.8 10/30/2022   MCV 84.9 10/30/2022   PLT 117 (L) 10/30/2022   Lab Results  Component Value Date   ALT 20 10/30/2022   AST 20 10/30/2022   ALKPHOS 158 (H) 10/30/2022   BILITOT 0.3 10/30/2022   Lab Results  Component Value Date   CREATININE 0.61 10/30/2022   Lab Results  Component Value Date   BUN 7 10/30/2022   UPC: 0.14  Imaging:  Last BPP (9/19): 8/8 for both twins  Last growth scan (9/5): Twin A 2165g (35%), Twin B 2288g (52%)  EFM: Twin A baseline 130 bpm, moderate variability, + accels, - decels Twin B baseline 140 bpm, moderate variability, + accels, - decels  Toco: no ctxs   A/P:  46WU [id number] at  [redacted]w[redacted]d w/DCDA twins, hypothyroidism, h/o CS x2, gestational thrombocytopenia, and gestational versus chronic HTN now with severe preeclampsia. Patient with sustained severe range blood pressures, ongoing headache not responsive to Tylenol, and spots in vision. Labs and physical exam overall reassuring. Given sustained severe range and ongoing symptoms meeting criteria for severe PEC. Received IV labetalol 20mg  x1, started on IV Magnesium gtt, plan to continue until 24h postpartum. Plan for delivery via 3' RCS tonight. Recommendations and plan reviewed with patient. Patient desires permanent sterilization. Risks and benefits of 3'RCS with bilateral salpingectomy reviewed with patient. Discussed that given 3' repeat patient is at higher risk for bleeding requiring transfusion, damage to surrounding organs specifically bladder and bowel, and hemorrhage requiring hysterectomy. Also advised that possible we will not be able to reach tubes due to scar tissue and discussed alternative method of contraception, specifically intra-op IUD placement. Patient declines IUD and prefers to discuss alternative options post-op if needed.   #PEC w/SF - PEC labs, UPC wnl  - Sustained SR x1, s/p IVP labetalol 20mg  9/19 @ 620p - Mag gtt until 24h PP (s/p bolus) w/mag checks per protocol - Tylenol and Mag Ox for headache PRN  - NPO - last ate at 930a - T&S pending, cross match for 2 units pRBC  - Will proceed to OR  when ready  Marlene Bast, MD

## 2022-10-30 NOTE — MAU Note (Signed)
RN at bedside from 1753 to 1833 adjusting monitors and changing patient positions to attempt tracing twin A.

## 2022-10-30 NOTE — MAU Note (Signed)
RN called lab to inquire about  protein-creatinine ratio . Lab tech transferred RN back and forth between lab processing and microbiology. After being transferred multiple times, lab tech informed RN  that they located the sample and would begin processing.

## 2022-10-30 NOTE — Op Note (Deleted)
OB Operative Report    PROCEDURE: tertiary repeat low transverse cesarean section   PRE-OPERATIVE DIAGNOSIS: Severe preeclampsia, h/o CD x2    POST-OPERATIVE DIAGNOSIS: Same   SURGEON: Marlene Bast, MD   ASSISTANT: Dorisann Frames, CNM & Shea Evans, MD    FINDINGS: Pecola Leisure A is female infant delivered in breech presentation, APGARs 8/8, weighing 2570g. Baby B is female infant delivered in LOA position, APGARs 8/8, weight pending.  Hysterotomy closed in single layer, uterine septum noted on exam. Bilateral salpingectomy performed, however left fimbriae adherent to left ovary and small portion of fimbriae remained. Normal appearing ovaries bilaterally. Moderate adhesions noted at muscle and fascial layers. Hemostatic at end of case.    EBL:   FLUIDS: 1000 mL   URINE OUTPUT:   COMPLICATIONS: none   Procedure in Detail Patient is a 32yo [id number] at [redacted]w[redacted]d w/DCDA twins, hypothyroidism, h/o CS x2, gestational thrombocytopenia, and gestational versus chronic HTN now with severe preeclampsia. Decision made to proceed with delivery for severe preeclampsia. Risks, benefits, and alternatives reviewed with patient and consent signed and witnessed. Patient was taken to the operating room where epidural anesthesia was administered and found to be adequate. She was then  prepped and draped in the usual manner. SCD boots were placed. Ancef were administered for surgical antibiotic prophylaxis. Time out was performed.   A Pfannenstiel incision was made using the scalpel. The incision was continued down to fascia using cautery. The scalpel was used to incise the fascia bilaterally. The fascial incision was extended using curved mayo scissors. The fascia was grasped with two Kocher clamps and dissected off of the underlying muscle bluntly and with scalpel in the inferior and superior aspects. A small window into peritoneum was noted. Two allis clamps were used to grasp either side of the rectus muscles  which were then divided in the midline with the scalpel. Dense adhesions between muscle and fascia was encountered and carefully dissected to achieve excellent visualization of the uterus. A bladder flap was created with Metzenbaum scissors and reflected inferiorly.    Uterine incision was made with the scalpel and extended bluntly. Clear amniotic fluid noted. Infant A delivered in breech presentation without issue. Delayed cord clamping was performed and infant was handed to awaiting pediatrician. Infant B was delivered in LOA position shortly after. Delayed cord clamping was performed and infant was handed to awaiting pediatrician.    Pitocin was started. The placenta was manually delivered and the endometrial cavity was cleaned out with dry lap x2. The uterus was exteriorized with good uterine tone. The hysterotomy was closed in a single layer using 0-vicryl. The right fallopian tube was grasped with a babcock and carefully dissected from the mesosalpinx and the cornua using Ligasure. The same was performed on left fallopian tube, however distal portion of fimbriae completely adherent to left ovary. Tube transected at most distal free portion and decision made to leave remainder of tubal portion so as not to damage the left ovary. The uterus was replaced into the abdominal cavity. The gutters were irrigated and cleaned bilaterally. Inspection of the hysterotomy, adnexae, and bladder flap showed hemostasis and normal anatomy. Normal bilateral ovaries noted.    Fascia was closed using 0-vicryl in a running fashion. The subcutaneous layer was closed using 2-0 plain interrupted sutures. The skin was closed using 4-0 vicryl. Steri strips and a sterile dressing were placed. Sponge and instrument counts correct at end of case. Patient moved to recovery room in stable condition.  Marlene Bast, MD

## 2022-10-30 NOTE — MAU Provider Note (Addendum)
History     [id number]  Arrival date and time: 10/30/22 1537    Chief Complaint  Patient presents with   Headache   Hypertension     HPI Christina Costa is a 32 y.o. at [redacted]w[redacted]d who presents for hypertension & headache. Was diagnosed with gestational hypertension earlier in the pregnancy & has been on labetalol. Labetalol was increased to 200 BID on Sunday. Since then has had recurrent headaches & eye pressure. Has been taking tylenol without relief. Last dose of tylenol was this morning. Currently rates h/a 7/10. No visual disturbance or epigastric pain. Reports good fetal movement x2.  Was in the office today & was sent here for evaluation.    OB History     Gravida  4   Para  2   Term  2   Preterm      AB  1   Living  2      SAB  1   IAB      Ectopic      Multiple  0   Live Births  2           Past Medical History:  Diagnosis Date   Headache    Hypothyroidism    hypothyroidisn    since age 77   Pregnancy induced hypertension    Seasonal allergies     Past Surgical History:  Procedure Laterality Date   CESAREAN SECTION N/A 07/09/2018   Procedure: CESAREAN SECTION;  Surgeon: Ranae Pila, MD;  Location: [facility] LD ORS;  Service: Obstetrics;  Laterality: N/A;  Primary  edc 07/27/18 NKDA need RNFA   CESAREAN SECTION N/A    [employer] 06/17/2020   CESAREAN SECTION N/A 05/29/2021   Procedure: CESAREAN SECTION;  Surgeon: Toy Baker, DO;  Location: [facility] LD ORS;  Service: Obstetrics;  Laterality: N/A;   DILATION AND EVACUATION N/A 09/16/2017   Procedure: DILATATION AND EVACUATION;  Surgeon: Ranae Pila, MD;  Location: [facility] ORS;  Service: Gynecology;  Laterality: N/A;   HAND SURGERY  01/2019   permanent retainers     upper and lower    WISDOM TOOTH EXTRACTION      Family History  Problem Relation Age of Onset   Allergy (severe) Mother    Hypothyroidism Mother    Rheum arthritis Mother    Arthritis Maternal Grandmother    Dementia  Maternal Grandmother    Heart disease Maternal Grandfather    Heart disease Paternal Grandfather     Allergies  Allergen Reactions   Wheat    Gluten Meal Nausea And Vomiting    No current facility-administered medications on file prior to encounter.   Current Outpatient Medications on File Prior to Encounter  Medication Sig Dispense Refill   acetaminophen (TYLENOL) 500 MG tablet Take 1,000 mg by mouth every 6 (six) hours as needed.     aspirin EC 81 MG tablet Take 81 mg by mouth daily. Swallow whole.     labetalol (NORMODYNE) 100 MG tablet Take 200 mg by mouth 2 (two) times daily.     ondansetron (ZOFRAN) 4 MG tablet Take 4 mg by mouth every 8 (eight) hours as needed for nausea or vomiting.     Prenatal Vit-Fe Fumarate-FA (PRENATAL MULTIVITAMIN) TABS tablet Take 1 tablet by mouth daily at 12 noon. 30 tablet 1   SYNTHROID 100 MCG tablet Take 100 mcg by mouth daily.     potassium iodide (SSKI) 1 GM/ML solution Take by mouth once.     promethazine (  PHENERGAN) 25 MG tablet Take 1 tablet (25 mg total) by mouth at bedtime as needed for nausea or vomiting. 30 tablet 0     ROS Pertinent positives and negative per HPI, all others reviewed and negative  Physical Exam   BP (!) 137/97   Pulse 95   Temp 98.1 F (36.7 C) (Oral)   Resp 18   Ht 5\' 6"  (1.676 m)   Wt 131.1 kg   LMP 09/04/2020   SpO2 99%   BMI 46.65 kg/m   Patient Vitals for the past 24 hrs:  BP Temp Temp src Pulse Resp SpO2 Height Weight  10/30/22 1900 (!) 137/97 -- -- 95 -- 99 % -- --  10/30/22 1845 (!) 136/93 -- -- 100 -- 99 % -- --  10/30/22 1830 (!) 143/97 -- -- 99 -- 99 % -- --  10/30/22 1816 (!) 141/83 -- -- 95 -- -- -- --  10/30/22 1803 139/76 -- -- 95 -- -- -- --  10/30/22 1730 (!) 153/102 -- -- 92 -- 100 % -- --  10/30/22 1715 (!) 154/103 -- -- 89 -- 100 % -- --  10/30/22 1701 (!) 154/98 -- -- 86 -- -- -- --  10/30/22 1631 (!) 147/89 -- -- 83 -- -- -- --  10/30/22 1620 (!) 151/98 -- -- 80 18 100 % -- --   10/30/22 1618 -- 98.1 F (36.7 C) Oral -- -- 100 % -- --  10/30/22 1605 -- -- -- -- -- -- 5\' 6"  (1.676 m) 131.1 kg    Physical Exam Vitals and nursing note reviewed.  Constitutional:      General: She is not in acute distress.    Appearance: She is well-developed.  HENT:     Head: Normocephalic and atraumatic.  Pulmonary:     Effort: Pulmonary effort is normal. No respiratory distress.  Abdominal:     Palpations: Abdomen is soft.     Comments: gravid  Skin:    General: Skin is warm and dry.  Neurological:     Mental Status: She is alert.      Fetal Tracing: Baby A Baseline: 135 Variability: moderate Accelerations: 15x15 Decelerations: none  Baby B Baseline: 130 Variability: moderate Accelerations: 15x15 Decelerations: none  Toco: flat    Labs Results for orders placed or performed during the hospital encounter of 10/30/22 (from the past 24 hour(s))  Protein / creatinine ratio, urine     Status: None   Collection Time: 10/30/22  4:11 PM  Result Value Ref Range   Creatinine, Urine 92 mg/dL   Total Protein, Urine 13 mg/dL   Protein Creatinine Ratio 0.14 0.00 - 0.15 mg/mg[Cre]  CBC     Status: Abnormal   Collection Time: 10/30/22  5:23 PM  Result Value Ref Range   WBC 8.6 4.0 - 10.5 K/uL   RBC 4.45 3.87 - 5.11 MIL/uL   Hemoglobin 12.3 12.0 - 15.0 g/dL   HCT 16.1 09.6 - 04.5 %   MCV 84.9 80.0 - 100.0 fL   MCH 27.6 26.0 - 34.0 pg   MCHC 32.5 30.0 - 36.0 g/dL   RDW 40.9 81.1 - 91.4 %   Platelets 117 (L) 150 - 400 K/uL   nRBC 0.0 0.0 - 0.2 %  Comprehensive metabolic panel     Status: Abnormal   Collection Time: 10/30/22  5:23 PM  Result Value Ref Range   Sodium 138 135 - 145 mmol/L   Potassium 4.5 3.5 - 5.1 mmol/L   Chloride  103 98 - 111 mmol/L   CO2 22 22 - 32 mmol/L   Glucose, Bld 77 70 - 99 mg/dL   BUN 7 6 - 20 mg/dL   Creatinine, Ser 4.09 0.44 - 1.00 mg/dL   Calcium 9.4 8.9 - 81.1 mg/dL   Total Protein 6.4 (L) 6.5 - 8.1 g/dL   Albumin 3.2 (L)  3.5 - 5.0 g/dL   AST 20 15 - 41 U/L   ALT 20 0 - 44 U/L   Alkaline Phosphatase 158 (H) 38 - 126 U/L   Total Bilirubin 0.3 0.3 - 1.2 mg/dL   GFR, Estimated >91 >47 mL/min   Anion gap 13 5 - 15    Imaging No results found.  MAU Course  Procedures Lab Orders         CBC         Comprehensive metabolic panel         Protein / creatinine ratio, urine    Meds ordered this encounter  Medications   acetaminophen (TYLENOL) tablet 1,000 mg   DISCONTD: magnesium oxide (MAG-OX) tablet 400 mg   DISCONTD: labetalol (NORMODYNE) injection 20 mg   AND Linked Order Group    labetalol (NORMODYNE) injection 20 mg    labetalol (NORMODYNE) injection 40 mg    labetalol (NORMODYNE) injection 80 mg    hydrALAZINE (APRESOLINE) injection 10 mg   magnesium bolus via infusion 4 g   magnesium sulfate 40 grams in SWI 1000 mL OB infusion   0.9 %  sodium chloride infusion   labetalol (NORMODYNE) 5 MG/ML injection    Barnabas Harries, A: cabinet override   Imaging Orders  No imaging studies ordered today    MDM No severe range BPs while in MAU Has headache 7/10. Declines treatment of headache.  Preeclampsia labs ok. Platelets 117 in setting of known gestational thrombocytopenia  Dr. Gates Rigg notified of patient evaluation, Will come speak with patient regarding plan Assessment and Plan   1. Severe pre-eclampsia in third trimester   2. Benign gestational thrombocytopenia in third trimester (HCC)   3. Dichorionic diamniotic twin pregnancy in third trimester   4. [redacted] weeks gestation of pregnancy    Dr. Gates Rigg on unit to speak with patient Developed SRBPs while Dr. Gates Rigg speaking with patient - plans to deliver.   Judeth Horn, NP 10/30/22 7:44 PM

## 2022-10-30 NOTE — Transfer of Care (Cosign Needed)
Immediate Anesthesia Transfer of Care Note  Patient: Christina Costa  Procedure(s) Performed: Repeat CESAREAN SECTION MULTI-GESTATIONAL WITH TUBAL by Salpingectomy (Bilateral)  Patient Location: PACU  Anesthesia Type:Spinal and Epidural  Level of Consciousness: awake, alert , and oriented  Airway & Oxygen Therapy: Patient Spontanous Breathing  Post-op Assessment: Report given to RN and Post -op Vital signs reviewed and stable  Post vital signs: stable  Last Vitals:  Vitals Value Taken Time  BP 137/88   Temp    Pulse 70   Resp 12   SpO2 100     Last Pain:  Vitals:   10/30/22 2014  TempSrc: Oral  PainSc:          Complications: No notable events documented.

## 2022-10-30 NOTE — Anesthesia Preprocedure Evaluation (Addendum)
Anesthesia Evaluation  Patient identified by MRN, date of birth, ID band Patient awake    Reviewed: Allergy & Precautions, NPO status , Patient's Chart, lab work & pertinent test results, reviewed documented beta blocker date and time   Airway Mallampati: III  TM Distance: >3 FB Neck ROM: Full    Dental no notable dental hx. (+) Teeth Intact, Dental Advisory Given   Pulmonary  Snores   Pulmonary exam normal breath sounds clear to auscultation       Cardiovascular hypertension, Pt. on medications and Pt. on home beta blockers Normal cardiovascular exam Rhythm:Regular Rate:Normal  Echo 09/18/22  1. Left ventricular ejection fraction, by estimation, is 70 to 75%. The  left ventricle has hyperdynamic function. The left ventricle has no  regional wall motion abnormalities. Left ventricular diastolic parameters  were normal.   2. Right ventricular systolic function is normal. The right ventricular  size is normal.   3. The mitral valve is normal in structure. Mild mitral valve  regurgitation.   4. The aortic valve is normal in structure. Aortic valve regurgitation is  not visualized.   EKG 08/22/22 NSR, low voltage   Neuro/Psych  Headaches  negative psych ROS   GI/Hepatic Neg liver ROS,GERD  Medicated,,  Endo/Other  Hypothyroidism  Morbid obesity  Renal/GU negative Renal ROS  negative genitourinary   Musculoskeletal negative musculoskeletal ROS (+)    Abdominal  (+) + obese  Peds  Hematology  (+) Blood dyscrasia Gestational Thrombocytopenia- Plt 117k   Anesthesia Other Findings   Reproductive/Obstetrics (+) Pregnancy Previous C/Section x 2 Di/Di Twin gestation 35 4/7 weeks                              Anesthesia Physical Anesthesia Plan  ASA: 3 and emergent  Anesthesia Plan: Spinal   Post-op Pain Management: Minimal or no pain anticipated   Induction: Intravenous  PONV Risk Score  and Plan:   Airway Management Planned: Natural Airway  Additional Equipment: None and Fetal Monitoring  Intra-op Plan:   Post-operative Plan:   Informed Consent: I have reviewed the patients History and Physical, chart, labs and discussed the procedure including the risks, benefits and alternatives for the proposed anesthesia with the patient or authorized representative who has indicated his/her understanding and acceptance.     Dental advisory given  Plan Discussed with: CRNA and Anesthesiologist  Anesthesia Plan Comments:         Anesthesia Quick Evaluation

## 2022-10-30 NOTE — Anesthesia Procedure Notes (Signed)
Spinal  Patient location during procedure: OR Start time: 10/30/2022 9:52 PM End time: 10/30/2022 9:58 PM Reason for block: surgical anesthesia Staffing Performed: anesthesiologist  Anesthesiologist: Mal Amabile, MD Performed by: Mal Amabile, MD Authorized by: Mal Amabile, MD   Preanesthetic Checklist Completed: patient identified, IV checked, site marked, risks and benefits discussed, surgical consent, monitors and equipment checked, pre-op evaluation and timeout performed Spinal Block Patient position: sitting Prep: DuraPrep and site prepped and draped Patient monitoring: cardiac monitor, continuous pulse ox, blood pressure and heart rate Approach: midline Location: L3-4 Injection technique: catheter Needle Needle type: Tuohy and Spinocan  Needle gauge: 24 G Needle length: 12.7 cm Needle insertion depth: 7 cm Catheter type: closed end flexible Catheter size: 19 g Assessment Sensory level: T4 Events: CSF return Additional Notes Epidural performed using LOR with air technique. No CSF, Heme or paresthesias. SAB performed through the epidural needle using 24ga Spinocan needle. CSF clear with free flow and no paresthesias. Local anesthetic and narcotics injected through the spinal needle and withdrawn. Epidural catheter threaded 5cm into the epidural space and the epidural needle was withdrawn. A sterile dressing was applied and the patient placed supine with LUD. The patient tolerated the procedure well and adequate sensory level was obtained.

## 2022-10-30 NOTE — MAU Note (Signed)
.  Camera Haker is a 32 y.o. at [redacted]w[redacted]d here in MAU reporting: Sent over from her OB office for elevated BP with PIH symptoms. She has been having an on-going HA and visual changes started today. Denies VB or LOF. Reports good FM. Scheduled for repeat C/S.   Last PO intake: Water 30 minutes ago, granola around 0900.  PIH Assessment: Headache present: Yes ;Has not responded to treatment and ;Last took Tylenol at 1000 Visual disturbances: Blurred, pressure behind her eyes RUQ pain/Epigastric: None Atypical edema:  pt reports some new swelling in her feet this AM Hx of HBP: Pre-E BP Medications: Labetalol 200 mg and BID  Onset of complaint: Today Pain score: 7/10 Vitals:   10/30/22 1618 10/30/22 1620  BP:  (!) 151/98  Pulse:  80  Resp:  18  Temp: 98.1 F (36.7 C)   SpO2: 100% 100%     FHT: A) 145 and B) 135   Lab orders placed from triage: UA

## 2022-10-31 ENCOUNTER — Encounter (HOSPITAL_COMMUNITY): Payer: Self-pay | Admitting: Obstetrics

## 2022-10-31 DIAGNOSIS — O139 Gestational [pregnancy-induced] hypertension without significant proteinuria, unspecified trimester: Secondary | ICD-10-CM | POA: Diagnosis present

## 2022-10-31 LAB — CBC
HCT: 38.3 % (ref 36.0–46.0)
Hemoglobin: 12.4 g/dL (ref 12.0–15.0)
MCH: 27.7 pg (ref 26.0–34.0)
MCHC: 32.4 g/dL (ref 30.0–36.0)
MCV: 85.7 fL (ref 80.0–100.0)
Platelets: 120 10*3/uL — ABNORMAL LOW (ref 150–400)
RBC: 4.47 MIL/uL (ref 3.87–5.11)
RDW: 14.5 % (ref 11.5–15.5)
WBC: 11.5 10*3/uL — ABNORMAL HIGH (ref 4.0–10.5)
nRBC: 0 % (ref 0.0–0.2)

## 2022-10-31 LAB — RPR: RPR Ser Ql: NONREACTIVE

## 2022-10-31 MED ORDER — PRENATAL MULTIVITAMIN CH
1.0000 | ORAL_TABLET | Freq: Every day | ORAL | Status: DC
  Administered 2022-10-31 – 2022-11-04 (×5): 1 via ORAL
  Filled 2022-10-31 (×5): qty 1

## 2022-10-31 MED ORDER — SENNOSIDES-DOCUSATE SODIUM 8.6-50 MG PO TABS
2.0000 | ORAL_TABLET | Freq: Every day | ORAL | Status: DC
  Administered 2022-10-31 – 2022-11-04 (×5): 2 via ORAL
  Filled 2022-10-31 (×5): qty 2

## 2022-10-31 MED ORDER — OXYCODONE HCL 5 MG PO TABS
5.0000 mg | ORAL_TABLET | ORAL | Status: DC | PRN
  Administered 2022-11-01 – 2022-11-02 (×3): 5 mg via ORAL
  Filled 2022-10-31 (×3): qty 1

## 2022-10-31 MED ORDER — MENTHOL 3 MG MT LOZG: 1.0000 | LOZENGE | OROMUCOSAL | Status: DC | PRN

## 2022-10-31 MED ORDER — CEFAZOLIN IN SODIUM CHLORIDE 3-0.9 GM/100ML-% IV SOLN: 3.0000 g | INTRAVENOUS | Status: DC

## 2022-10-31 MED ORDER — ACETAMINOPHEN 500 MG PO TABS
1000.0000 mg | ORAL_TABLET | Freq: Four times a day (QID) | ORAL | Status: DC
  Administered 2022-10-31 – 2022-11-04 (×17): 1000 mg via ORAL
  Filled 2022-10-31 (×18): qty 2

## 2022-10-31 MED ORDER — LORATADINE 10 MG PO TABS
10.0000 mg | ORAL_TABLET | Freq: Every day | ORAL | Status: DC
  Administered 2022-10-31 – 2022-11-04 (×5): 10 mg via ORAL
  Filled 2022-10-31 (×5): qty 1

## 2022-10-31 MED ORDER — PROMETHAZINE HCL 25 MG PO TABS: 25.0000 mg | ORAL_TABLET | Freq: Every evening | ORAL | Status: DC | PRN

## 2022-10-31 MED ORDER — ENOXAPARIN SODIUM 80 MG/0.8ML IJ SOSY
65.0000 mg | PREFILLED_SYRINGE | INTRAMUSCULAR | Status: DC
  Administered 2022-10-31 – 2022-11-03 (×4): 65 mg via SUBCUTANEOUS
  Filled 2022-10-31 (×4): qty 0.8

## 2022-10-31 MED ORDER — OXYTOCIN-SODIUM CHLORIDE 30-0.9 UT/500ML-% IV SOLN: 2.5000 [IU]/h | INTRAVENOUS | Status: AC

## 2022-10-31 MED ORDER — GABAPENTIN 100 MG PO CAPS
100.0000 mg | ORAL_CAPSULE | Freq: Three times a day (TID) | ORAL | Status: DC
  Administered 2022-10-31 – 2022-11-04 (×14): 100 mg via ORAL
  Filled 2022-10-31 (×14): qty 1

## 2022-10-31 MED ORDER — KETOROLAC TROMETHAMINE 30 MG/ML IJ SOLN
30.0000 mg | Freq: Four times a day (QID) | INTRAMUSCULAR | Status: AC
  Administered 2022-10-31 (×4): 30 mg via INTRAVENOUS
  Filled 2022-10-31 (×4): qty 1

## 2022-10-31 MED ORDER — LACTATED RINGERS IV SOLN: INTRAVENOUS | Status: DC

## 2022-10-31 MED ORDER — COCONUT OIL OIL
1.0000 | TOPICAL_OIL | Status: DC | PRN
  Administered 2022-11-02: 1 via TOPICAL

## 2022-10-31 MED ORDER — LEVOTHYROXINE SODIUM 25 MCG PO TABS
100.0000 ug | ORAL_TABLET | Freq: Every day | ORAL | Status: DC
  Administered 2022-10-31 – 2022-11-04 (×5): 100 ug via ORAL
  Filled 2022-10-31 (×5): qty 4

## 2022-10-31 MED ORDER — IBUPROFEN 600 MG PO TABS
600.0000 mg | ORAL_TABLET | Freq: Four times a day (QID) | ORAL | Status: DC
  Administered 2022-11-01 – 2022-11-04 (×15): 600 mg via ORAL
  Filled 2022-10-31 (×15): qty 1

## 2022-10-31 MED ORDER — DIBUCAINE (PERIANAL) 1 % EX OINT: 1.0000 | TOPICAL_OINTMENT | CUTANEOUS | Status: DC | PRN

## 2022-10-31 MED ORDER — WITCH HAZEL-GLYCERIN EX PADS: 1.0000 | MEDICATED_PAD | CUTANEOUS | Status: DC | PRN

## 2022-10-31 MED ORDER — SIMETHICONE 80 MG PO CHEW
80.0000 mg | CHEWABLE_TABLET | Freq: Three times a day (TID) | ORAL | Status: DC
  Administered 2022-10-31 – 2022-11-04 (×12): 80 mg via ORAL
  Filled 2022-10-31 (×12): qty 1

## 2022-10-31 MED ORDER — LABETALOL HCL 200 MG PO TABS
200.0000 mg | ORAL_TABLET | Freq: Two times a day (BID) | ORAL | Status: DC
  Administered 2022-10-31 – 2022-11-01 (×3): 200 mg via ORAL
  Filled 2022-10-31 (×3): qty 1

## 2022-10-31 MED ORDER — OXYCODONE HCL 5 MG PO TABS: 10.0000 mg | ORAL_TABLET | ORAL | Status: DC | PRN

## 2022-10-31 MED ORDER — DIPHENHYDRAMINE HCL 25 MG PO CAPS
25.0000 mg | ORAL_CAPSULE | Freq: Four times a day (QID) | ORAL | Status: DC | PRN
  Administered 2022-10-31: 25 mg via ORAL

## 2022-10-31 MED ORDER — SIMETHICONE 80 MG PO CHEW
80.0000 mg | CHEWABLE_TABLET | ORAL | Status: DC | PRN
  Administered 2022-10-31 – 2022-11-02 (×3): 80 mg via ORAL
  Filled 2022-10-31 (×2): qty 1

## 2022-10-31 MED ORDER — SOD CITRATE-CITRIC ACID 500-334 MG/5ML PO SOLN: 30.0000 mL | ORAL | Status: DC

## 2022-10-31 NOTE — Anesthesia Postprocedure Evaluation (Signed)
Anesthesia Post Note  Patient: Christina Costa  Procedure(s) Performed: Repeat CESAREAN SECTION MULTI-GESTATIONAL WITH TUBAL by Salpingectomy (Bilateral)     Patient location during evaluation: PACU Anesthesia Type: Spinal Level of consciousness: oriented and awake and alert Pain management: pain level controlled Vital Signs Assessment: post-procedure vital signs reviewed and stable Respiratory status: spontaneous breathing, respiratory function stable and nonlabored ventilation Cardiovascular status: blood pressure returned to baseline and stable Postop Assessment: no headache, no backache, no apparent nausea or vomiting, spinal receding and patient able to bend at knees Anesthetic complications: no   No notable events documented.  Last Vitals:  Vitals:   10/31/22 0030 10/31/22 0045  BP: 132/83 127/76  Pulse: 78 80  Resp: 14 18  Temp:  36.5 C  SpO2: 97% 96%    Last Pain:  Vitals:   10/31/22 0045  TempSrc: Oral  PainSc:    Pain Goal:    LLE Motor Response: Purposeful movement (10/31/22 0030) LLE Sensation: Tingling (10/31/22 0030) RLE Motor Response: Purposeful movement (10/31/22 0030) RLE Sensation: Tingling (10/31/22 0030)     Epidural/Spinal Function Cutaneous sensation: Able to Discern Pressure (10/31/22 0030), Patient able to flex knees: Yes (10/31/22 0030), Patient able to lift hips off bed: No (10/31/22 0030), Back pain beyond tenderness at insertion site: No (10/31/22 0030), Progressively worsening motor and/or sensory loss: No (10/31/22 0030), Bowel and/or bladder incontinence post epidural: No (10/31/22 0030)  Hudsyn Champine A.

## 2022-10-31 NOTE — Lactation Note (Signed)
This note was copied from a baby's chart. Lactation Consultation Note  Patient Name: Christina Costa [id number] Date: 10/31/2022 Age:31 hours Reason for consult: Follow-up assessment;Late-preterm 34-36.6wks;Infant < 6lbs;Breastfeeding assistance  P3- LC reviewed feeding plan put in place by NICU [facility] with MOB. Feedings should not exceed 30 minutes total. MOB stated that she felt more comfortable with feeding one infant at a time. Donor milk was discussed and consent form was signed.  Feeding of infant Christina B: Infant Christina B was fed on the breast first. She was placed in the football hold on the right breast. After a few attempts, infant did latch and nurse. Infant Christina B needed some stimulation to continue feeding. Suck was nutritive, but soft. Infant Christina B nursed on the breast for 15 minutes, then was passed to FOB who attempted to bottle feed 13 mL. Infant Christina B was very tired and would not take bottle for FOB, so LC Windell Moulding took over and was able to get infant Christina B to eat 12 mL DBM.  Feeding of infant Christina A: Infant Christina A was fed secondly. She was placed in the football hold on the left breast. After multiple attempts, infant Christina A did latch but mostly held nipple in the mouth. LC attempted to stimulate infant Christina A, which resulted in a couple of soft suckles. After 5 minutes of attempting a feeding, LC encouraged MOB to stop breastfeeding due to sleepiness and give DBM. MOB attempted to feed DBM, but infant was too sleepy. LC Amil Amen took over and fed infant 13 mL while side laying. Infant tolerated DBM well.  Due to infant girls A and B's sleepiness, LC encouraged MOB to offer DBM first, then offer breast to ensure infant's calorie intake is sufficient. FOB verbalized worry about sleeping tonight due to infant Christina B gagging and spitting up clear mucus while LC was in room. LC demonstrated how to clear mucus if needed. [facility] informed nurse of FOB's concern and she stated that she will talk  to the physician.   LC encouraged MOB to continue breast stimulation with the pump and offering the breast. LC encouraged MOB to call for further assistance if needed.  Maternal Data Has patient been taught Hand Expression?: Yes Does the patient have breastfeeding experience prior to this delivery?: Yes How long did the patient breastfeed?: 9 months  Feeding Mother's Current Feeding Choice: Breast Milk and Donor Milk Nipple Type: Nfant Standard Flow (white)  LATCH Score Latch: Repeated attempts needed to sustain latch, nipple held in mouth throughout feeding, stimulation needed to elicit sucking reflex.  Audible Swallowing: None  Type of Nipple: Everted at rest and after stimulation  Comfort (Breast/Nipple): Soft / non-tender  Hold (Positioning): Assistance needed to correctly position infant at breast and maintain latch.  LATCH Score: 6   Lactation Tools Discussed/Used Tools: Pump;Flanges Flange Size: 21 Breast pump type: Double-Electric Breast Pump;Manual Pump Education: Setup, frequency, and cleaning;Milk Storage Reason for Pumping: Support milk supply/LPT twins/<6 lbs Pumping frequency: Encouraged to pump at least every 3 hrs  Interventions Interventions: Breast feeding basics reviewed;Assisted with latch;Breast massage;Breast compression;Adjust position;Support pillows;Position options;DEBP;Education;Pace feeding  Discharge Pump: Personal;DEBP Chiropractor and MomCozy) WIC Program: No  Consult Status Consult Status: Follow-up Date: 11/01/22 Follow-up type: In-patient    Dema Severin BS, IBCLC 10/31/2022, 1:26 PM

## 2022-10-31 NOTE — Op Note (Signed)
OB Operative Report    PROCEDURE: tertiary repeat low transverse cesarean section   PRE-OPERATIVE DIAGNOSIS: Severe preeclampsia, h/o CD x2    POST-OPERATIVE DIAGNOSIS: Same   SURGEON: Marlene Bast, MD   ASSISTANT: Dorisann Frames, CNM & Shea Evans, MD    FINDINGS: Pecola Leisure A is female infant delivered in breech presentation, APGARs 8/8, weighing 2570g. Baby B is female infant delivered in LOA position, APGARs 8/8, weight 2670g.  Hysterotomy closed in single layer, uterine septum noted on exam. Bilateral salpingectomy performed, however left fimbriae adherent to left ovary and small portion of fimbriae remained. Normal appearing ovaries bilaterally. Moderate adhesions noted at muscle and fascial layers. Hemostatic at end of case.    EBL:   FLUIDS: 1000 mL   URINE OUTPUT:   COMPLICATIONS: none   Procedure in Detail Patient is a 32yo [id number] at [redacted]w[redacted]d w/DCDA twins, hypothyroidism, h/o CS x2, gestational thrombocytopenia, and gestational versus chronic HTN now with severe preeclampsia. Decision made to proceed with delivery for severe preeclampsia. Risks, benefits, and alternatives reviewed with patient and consent signed and witnessed. Patient was taken to the operating room where epidural anesthesia was administered and found to be adequate. She was then  prepped and draped in the usual manner. SCD boots were placed. Ancef were administered for surgical antibiotic prophylaxis. Time out was performed.   A Pfannenstiel incision was made using the scalpel. The incision was continued down to fascia using cautery. The scalpel was used to incise the fascia bilaterally. The fascial incision was extended using curved mayo scissors. The fascia was grasped with two Kocher clamps and dissected off of the underlying muscle bluntly and with scalpel in the inferior and superior aspects. A small window into peritoneum was noted. Two allis clamps were used to grasp either side of the rectus muscles  which were then divided in the midline with the scalpel. Dense adhesions between muscle and fascia was encountered and carefully dissected to achieve excellent visualization of the uterus. A bladder flap was created with Metzenbaum scissors and reflected inferiorly.    Uterine incision was made with the scalpel and extended bluntly. Clear amniotic fluid noted. Infant A delivered in breech presentation without issue. Delayed cord clamping was performed and infant was handed to awaiting pediatrician. Infant B was delivered in LOA position shortly after. Delayed cord clamping was performed and infant was handed to awaiting pediatrician.    Pitocin was started. The placenta was manually delivered and the endometrial cavity was cleaned out with dry lap x2. The uterus was exteriorized with good uterine tone. The hysterotomy was closed in a single layer using 0-vicryl. The right fallopian tube was grasped with a babcock and carefully dissected from the mesosalpinx and the cornua using Ligasure. The same was performed on left fallopian tube, however distal portion of fimbriae completely adherent to left ovary. Tube transected at most distal free portion and decision made to leave remainder of tubal portion so as not to damage the left ovary. The uterus was replaced into the abdominal cavity. The gutters were irrigated and cleaned bilaterally. Inspection of the hysterotomy, adnexae, and bladder flap showed hemostasis and normal anatomy. Normal bilateral ovaries noted.    Fascia was closed using 0-vicryl in a running fashion. The subcutaneous layer was closed using 2-0 plain interrupted sutures. The skin was closed using 4-0 vicryl. Steri strips and a sterile dressing were placed. Sponge and instrument counts correct at end of case. Patient moved to recovery room in stable condition.  Marlene Bast, MD

## 2022-10-31 NOTE — Progress Notes (Addendum)
Postpartum Progress Note  S: Patient seen and examined at bedside with newborns at bedside. She is doing overall well this morning without complaints. She is ambulating, voiding, tolerating regular diet without issue. She reports her pain is well controlled. Breastfeeding twins, baby girls both doing well. She denies headache, visual disturbances, RUQ pain, new/worsening LEE, chest pain or shortness of breath.   O:  Today's Vitals   10/31/22 0610 10/31/22 0744 10/31/22 0825 10/31/22 0900  BP:  118/68    Pulse:  73    Resp: 18 19 16 18   Temp:      TempSrc:      SpO2:  99%    Weight:      Height:      PainSc:   0-No pain 0-No pain   Body mass index is 46.65 kg/m.   PE: GA: well appearing, NAD Chest: CTAB, RRR Abd: soft, non-tender, fundus firm below umbilicus Inc: clean, dry, intact; pressure dressing removed with honeycomb dressing intact beneath Ext: trace edema, no TTP  Lab Results  Component Value Date   WBC 11.5 (H) 10/31/2022   HGB 12.4 10/31/2022   HCT 38.3 10/31/2022   MCV 85.7 10/31/2022   PLT 120 (L) 10/31/2022   A/P: 32yo [id number] with hypothyroidism, gestational thrombocytopenia, PEC w/SF (likely superimposed w/cHTN) on Magnesium gtt POD#1 s/p 3'RCS w/bilateral salpingectomy of DCDA twins, doing well and progressing appropriately. MR BP x2 since delivery, otherwise normal range. Last IVP labtealol yesterday at 6pm. Reports no further headache or visual changes since yesterday. No signs or symptoms of worsening PEC or Mag toxicity. Will continue Mag gtt x24h PP (until 10:40pm tonight) with continued close BP monitoring. Plan to repeat PEC labs if rising BP or new symptoms. Otherwise routine prenatal care. Likely discharge POD#3.   Marlene Bast, MD

## 2022-10-31 NOTE — Lactation Note (Addendum)
This note was copied from a baby's chart. Lactation Consultation Note  Patient Name: Christina Costa [id number] Date: 10/31/2022 Age:31 hours Reason for consult: Initial assessment;Multiple gestation;Late-preterm 34-36.6wks;Infant < 6lbs;Maternal endocrine disorder hypothyroidism and GHTN  LC in to visit with P3 Mom of LPT twins ([redacted]w[redacted]d) delivered by C/Section.  Mom with GHTN and is on magnesium sulfate.  Baby Christina A "Christina Costa" weighed 5 lbs 11.4 oz Baby Christina B " Christina Costa" weighed 5 lbs 13 oz  Both babies have latched and fed 3 times so far.  Mom reports latching has been deep onto the breast, with some slipping to nipple.  No latch scores recorded, Mom to notify RN at next feeding for LC to assess latching and feeding.  LC set up DEBP and assisted Mom with first pumping.  Mom expressed drops which she placed on baby A's lips, no rooting or cueing noted.  Babies are due to feed in an hour. CRIB CARDS placed in both cribs educating parents on the importance of supplementing babies after breastfeeding.  Mom is choosing donor breast milk.  RN notified.  Choking episode Twin B- LC noted baby B to be stirring and showing cues.  LC offered to assist with positioning and latching.  LC changed a wet diaper.  LC assisted placing pillow behind Mom's back vertically and pillow at her side for football hold.  LC noted that baby B had cyanosis of her face and torso and was struggling to breathe.  LC picked up baby and started patting her and she let out a cry and then appeared to be choking again.  LC bulb syringed corner of baby's mouth for mucousy emesis.  More patting and baby let out another cry.  Baby grunting with breathing and her color improved after a minute.  Baby placed STS on Mom's chest where her grunting improved after 2 minutes.  RN notified and asked for O2 sat monitor.  Plan recommended- 1-STS with babies 2- Offer the breast with feeding cues 3- after 3 hrs, awaken babies and offer  breast, and supplement per volume guidelines with donor breast milk +/EBM by paced bottle 4- Pump both breasts on initiation setting 5- ask for help prn    Maternal Data Has patient been taught Hand Expression?: Yes Does the patient have breastfeeding experience prior to this delivery?: Yes How long did the patient breastfeed?: 9 months  Feeding Mother's Current Feeding Choice: Breast Milk and Donor Milk  Lactation Tools Discussed/Used Tools: Pump;Flanges Flange Size: 21 Breast pump type: Double-Electric Breast Pump Pump Education: Setup, frequency, and cleaning;Milk Storage Reason for Pumping: Support milk supply/LPT twins/<6 lbs Pumping frequency: Encouraged to pump at least every 3 hrs  Interventions Interventions: Breast feeding basics reviewed;Skin to skin;Breast massage;Hand express;DEBP;LC Services brochure;CDC Guidelines for Breast Pump Cleaning  Discharge Pump: Personal;DEBP Chiropractor and MomCozy) WIC Program: No  Consult Status Consult Status: Follow-up Date: 11/01/22 Follow-up type: In-patient    Christina Costa 10/31/2022, 10:01 AM

## 2022-11-01 LAB — COMPREHENSIVE METABOLIC PANEL
ALT: 21 U/L (ref 0–44)
AST: 25 U/L (ref 15–41)
Albumin: 2.8 g/dL — ABNORMAL LOW (ref 3.5–5.0)
Alkaline Phosphatase: 118 U/L (ref 38–126)
Anion gap: 8 (ref 5–15)
BUN: 16 mg/dL (ref 6–20)
CO2: 23 mmol/L (ref 22–32)
Calcium: 8.9 mg/dL (ref 8.9–10.3)
Chloride: 106 mmol/L (ref 98–111)
Creatinine, Ser: 1.03 mg/dL — ABNORMAL HIGH (ref 0.44–1.00)
GFR, Estimated: >60 mL/min (ref >60)
Glucose, Bld: 93 mg/dL (ref 70–99)
Potassium: 4.4 mmol/L (ref 3.5–5.1)
Sodium: 137 mmol/L (ref 135–145)
Total Bilirubin: 0.5 mg/dL (ref 0.3–1.2)
Total Protein: 5.7 g/dL — ABNORMAL LOW (ref 6.5–8.1)

## 2022-11-01 LAB — CBC
HCT: 33.1 % — ABNORMAL LOW (ref 36.0–46.0)
Hemoglobin: 10.9 g/dL — ABNORMAL LOW (ref 12.0–15.0)
MCH: 28.9 pg (ref 26.0–34.0)
MCHC: 32.9 g/dL (ref 30.0–36.0)
MCV: 87.8 fL (ref 80.0–100.0)
Platelets: 103 10*3/uL — ABNORMAL LOW (ref 150–400)
RBC: 3.77 MIL/uL — ABNORMAL LOW (ref 3.87–5.11)
RDW: 14.8 % (ref 11.5–15.5)
WBC: 7 10*3/uL (ref 4.0–10.5)
nRBC: 0 % (ref 0.0–0.2)

## 2022-11-01 MED ORDER — LABETALOL HCL 200 MG PO TABS
200.0000 mg | ORAL_TABLET | Freq: Three times a day (TID) | ORAL | Status: DC
  Administered 2022-11-01 – 2022-11-03 (×5): 200 mg via ORAL
  Filled 2022-11-01 (×5): qty 1

## 2022-11-01 NOTE — Lactation Note (Signed)
This note was copied from a baby's chart. Lactation Consultation Note  Patient Name: Girl Raynesha Langreck [id number] Date: 11/01/2022 Age:31 hours   LC attempted to visit for follow up consult, but neither mom nor baby were in the room.LC to return.  Feeding Nipple Type: Slow - flow    Consult Status      Antionette Char 11/01/2022, 12:16 PM

## 2022-11-01 NOTE — Lactation Note (Signed)
This note was copied from a baby's chart. Lactation Consultation Note  Patient Name: Christina Costa [id number] Date: 11/01/2022 Age:31 hours Reason for consult: Follow-up assessment;Late-preterm 34-36.6wks;Infant < 6lbs;Multiple gestation;Other (Comment) (gHTN)  Visited with family of 31 hours old LPI NICU twin female "Christina Costa"; Ms. Mayford Knife is a P3 and experienced breastfeeding but this is her first time with twins. She was teary when entered the room, provided emotional support and let her know that we are here to help and assist in any way. She reported she hasn't been pumping consistently because she's been taking baby to breast; they are both rooming in [address]. She voiced she feels comfortable practicing independent breastfeeding but she's aware we are here to assist if needed. Provided education about how small babies might not have all the stamina needed to sustain a good supply and how consistent pumping will help to protect it. Reviewed pumping schedule, lactogenesis II, LPI behavior, benefits of STS care and anticipatory guidelines.   Feeding Mother's Current Feeding Choice: Breast Milk and Donor Milk Nipple Type: Slow - flow  Lactation Tools Discussed/Used Tools: Pump;Flanges;Coconut oil Flange Size: 21 Breast pump type: Double-Electric Breast Pump Pump Education: Setup, frequency, and cleaning;Milk Storage Reason for Pumping: LPI twins Pumping frequency: 3 times/24 hours, but she's been taking baby "A" to breast Pumped volume:  (droplets)  Interventions Interventions: Breast feeding basics reviewed;DEBP;Coconut oil;Education  Plan Encouraged pumping every 3 hours, ideally 8 pumping sessions/24 hours Breast massage and hand expression were also encouraged prior pumping She'll continue taking baby "Merci Costa" to breast on feeding cues and will continue supplementation with donor milk following supplementation guidelines per baby's age in hours   FOB and babies' auntie present  and supportive. All questions and concerns answered, family to contact [facility] services PRN.  Discharge Pump: Personal;Hands Free Chiropractor and Mom Cozy)  Consult Status Consult Status: Follow-up Date: 11/02/22 Follow-up type: In-patient   Shrinika Blatz Venetia Constable 11/01/2022, 1:19 PM

## 2022-11-01 NOTE — Progress Notes (Signed)
   Pt c/o pain near rib on right side, has had last day or so; incision pain controlled, tol po; ambulating; nml lochia; voids w/o difficulty Trying to breastfeed twins, working with lactation    Patient Vitals for the past 24 hrs:  BP Temp Temp src Pulse Resp SpO2  11/01/22 0757 (!) 140/94 98.6 F (37 C) Oral 77 20 --  11/01/22 0511 121/60 98.6 F (37 C) Oral 74 20 99 %  11/01/22 0027 136/85 98.3 F (36.8 C) Oral 77 19 100 %  10/31/22 2230 -- -- -- -- 16 --  10/31/22 2130 -- -- -- -- 16 --  10/31/22 2030 -- -- -- -- 16 --  10/31/22 1939 112/61 97.6 F (36.4 C) Oral 75 16 99 %  10/31/22 1820 -- -- -- -- 18 --  10/31/22 1702 -- -- -- -- 17 --  10/31/22 1600 -- -- -- -- 17 --  10/31/22 1523 110/64 -- -- 75 19 99 %  10/31/22 1436 -- -- -- -- 18 --  10/31/22 1351 135/75 -- -- 79 -- 98 %  10/31/22 1235 -- -- -- -- 16 --  10/31/22 1100 -- -- -- -- 16 --  10/31/22 1020 -- -- -- -- 16 --   Intake/Output Summary (Last 24 hours) at 11/01/2022 9604 Last data filed at 11/01/2022 0900 Gross per 24 hour  Intake 3724.01 ml  Output 2050 ml  Net 1674.01 ml     A&ox3 Rrr Ctab Abd: soft,nt,nd; fundus firm and below umb; dressing c/d/I; pt reports tenderness, localized later upper abomen - very small area LE: +1 le edema, nt bilat     Latest Ref Rng & Units 10/31/2022    4:11 AM 10/30/2022    8:17 PM 10/30/2022    5:23 PM  CBC  WBC 4.0 - 10.5 K/uL 11.5  9.8  8.6   Hemoglobin 12.0 - 15.0 g/dL 54.0  98.1  19.1   Hematocrit 36.0 - 46.0 % 38.3  40.3  37.8   Platelets 150 - 400 K/uL 120  123  117    A/P: pod2 s/p rltcs with bilat salpingectomy Doing well, contin care; encourage ambulation Severe pre-e: nml/mild range bps, now s/p 24 hr mag sulfate; labetalol 200mg  bid Hypothyroid - synthyroid Di/di twin girls - breastfeeding Low plts - ?gestational vs HELLP - otherwise nml labs, stable 120s, repeat today Upper abd pain - likely m/s, pt anxious, will check lfts

## 2022-11-01 NOTE — Lactation Note (Signed)
This note was copied from a baby's chart.  NICU Lactation Consultation Note  Patient Name: Christina Costa [id number] Date: 11/01/2022 Age:31 hours  Reason for consult: Follow-up assessment; NICU baby; Late-preterm 34-36.6wks; Infant < 6lbs; Multiple gestation; Other (Comment) (gHTN) Type of Endocrine Disorder?: Thyroid (hypothyroid (synthroid))  SUBJECTIVE Visited with family of 28 hours old LPI NICU twin female "Violet"; Ms. Mayford Knife is a P3 and experienced breastfeeding but this is her first time with twins. She was teary when entered the room, provided emotional support and let her know that we are here to help and assist in any way. She reported she hasn't been pumping consistently because she's been taking the other twin to breast; they are both rooming in 109. Provided education about how small babies might not have all the stamina needed to sustain a good supply and how consistent pumping will help to protect it. Reviewed pumping schedule, lactogenesis II, benefits of STS care and anticipatory guidelines.  OBJECTIVE Infant data: Mother's Current Feeding Choice: Breast Milk and Donor Milk  O2 Device: Room Air  Infant feeding assessment Scale for Readiness: 2 Scale for Quality: 4   Maternal data: [id number] C-Section, Low Transverse Has patient been taught Hand Expression?: Yes Hand Expression Comments: tiny drop of colostrum noted Significant Breast History:: (+) breast changes during the pregnancy Current breast feeding challenges:: NICU admission Previous breastfeeding challenges?: Other (Comment) (Pumping difficulties with her other babies) Does the patient have breastfeeding experience prior to this delivery?: Yes How long did the patient breastfeed?: 9 months Pumping frequency: 3 times/24 hours, but she's also taking the other twin to breast Pumped volume: 0 mL (droplets) Flange Size: 21 Risk factor for low/delayed milk supply:: prematurity, hypothyroid  Pump:  Personal, Hands Free Chiropractor and Mom Cozy)  ASSESSMENT Infant: Feeding Status: Scheduled 9-12-3-6 Feeding method: Tube/Gavage (Bolus) Nipple Type: Dr. Levert Feinstein Preemie  Maternal: Milk volume: Normal  INTERVENTIONS/PLAN Interventions: Interventions: Breast feeding basics reviewed; Coconut oil; DEBP; Education Tools: Pump; Flanges; Coconut oil Pump Education: Setup, frequency, and cleaning; Milk Storage  Plan: Encouraged pumping every 3 hours, ideally 8 pumping sessions/24 hours Breast massage and hand expression were also encouraged prior pumping She'll call for assistance when she's ready to take baby "Violet" back to breast, breastfeeding is deferred at this time due to desaturations  FOB and babies' auntie present and supportive. All questions and concerns answered, family to contact [facility] services PRN.  Consult Status: NICU follow-up NICU Follow-up type: Maternal D/C visit   Gurinder Toral Venetia Constable 11/01/2022, 1:08 PM

## 2022-11-02 ENCOUNTER — Other Ambulatory Visit: Payer: Self-pay

## 2022-11-02 LAB — COMPREHENSIVE METABOLIC PANEL
ALT: 21 U/L (ref 0–44)
AST: 24 U/L (ref 15–41)
Albumin: 2.7 g/dL — ABNORMAL LOW (ref 3.5–5.0)
Alkaline Phosphatase: 103 U/L (ref 38–126)
Anion gap: 8 (ref 5–15)
BUN: 18 mg/dL (ref 6–20)
CO2: 21 mmol/L — ABNORMAL LOW (ref 22–32)
Calcium: 8.8 mg/dL — ABNORMAL LOW (ref 8.9–10.3)
Chloride: 106 mmol/L (ref 98–111)
Creatinine, Ser: 0.96 mg/dL (ref 0.44–1.00)
GFR, Estimated: >60 mL/min (ref >60)
Glucose, Bld: 90 mg/dL (ref 70–99)
Potassium: 4 mmol/L (ref 3.5–5.1)
Sodium: 135 mmol/L (ref 135–145)
Total Bilirubin: 0.3 mg/dL (ref 0.3–1.2)
Total Protein: 5.6 g/dL — ABNORMAL LOW (ref 6.5–8.1)

## 2022-11-02 LAB — CBC
HCT: 31.7 % — ABNORMAL LOW (ref 36.0–46.0)
Hemoglobin: 10.3 g/dL — ABNORMAL LOW (ref 12.0–15.0)
MCH: 27.8 pg (ref 26.0–34.0)
MCHC: 32.5 g/dL (ref 30.0–36.0)
MCV: 85.4 fL (ref 80.0–100.0)
Platelets: 122 10*3/uL — ABNORMAL LOW (ref 150–400)
RBC: 3.71 MIL/uL — ABNORMAL LOW (ref 3.87–5.11)
RDW: 14.6 % (ref 11.5–15.5)
WBC: 5.8 10*3/uL (ref 4.0–10.5)
nRBC: 0 % (ref 0.0–0.2)

## 2022-11-02 NOTE — Progress Notes (Signed)
No h/a, vision changes, ruq pain; side pain nearly resolved Now with back pain at epidural site - using heating pad and nurse has informed anesthesia Tol po, ambulating, voids w/o difficulty, +flatus, nml lochia Breastfeeding   Patient Vitals for the past 24 hrs:  BP Temp Temp src Pulse Resp SpO2  11/02/22 0747 120/76 98.2 F (36.8 C) Oral 76 18 99 %  11/02/22 0428 131/68 97.6 F (36.4 C) Oral 83 16 100 %  11/01/22 2318 (!) 147/73 98.5 F (36.9 C) Oral 72 17 100 %  11/01/22 1938 (!) 140/84 98.3 F (36.8 C) -- 81 18 100 %  11/01/22 1723 (!) 145/87 98.3 F (36.8 C) Oral 85 18 100 %  11/01/22 1413 (!) 141/77 97.9 F (36.6 C) Oral 78 18 99 %   Intake/Output Summary (Last 24 hours) at 11/02/2022 1040 Last data filed at 11/02/2022 0432 Gross per 24 hour  Intake 1800 ml  Output 2300 ml  Net -500 ml    A&Ox3 Rrr Ctab Abd: soft,nt,nd; +bs; fundus firm and below umb; dressing: c/d/I LE: tr edema, nt bilat     Latest Ref Rng & Units 11/02/2022    8:18 AM 11/01/2022   10:48 AM 10/31/2022    4:11 AM  CBC  WBC 4.0 - 10.5 K/uL 5.8  7.0  11.5   Hemoglobin 12.0 - 15.0 g/dL 13.0  86.5  78.4   Hematocrit 36.0 - 46.0 % 31.7  33.1  38.3   Platelets 150 - 400 K/uL 122  103  120       Latest Ref Rng & Units 11/02/2022    8:18 AM 11/01/2022   10:48 AM 10/30/2022    5:23 PM  CMP  Glucose 70 - 99 mg/dL 90  93  77   BUN 6 - 20 mg/dL 18  16  7    Creatinine 0.44 - 1.00 mg/dL 6.96  2.95  2.84   Sodium 135 - 145 mmol/L 135  137  138   Potassium 3.5 - 5.1 mmol/L 4.0  4.4  4.5   Chloride 98 - 111 mmol/L 106  106  103   CO2 22 - 32 mmol/L 21  23  22    Calcium 8.9 - 10.3 mg/dL 8.8  8.9  9.4   Total Protein 6.5 - 8.1 g/dL 5.6  5.7  6.4   Total Bilirubin 0.3 - 1.2 mg/dL 0.3  0.5  0.3   Alkaline Phos 38 - 126 U/L 103  118  158   AST 15 - 41 U/L 24  25  20    ALT 0 - 44 U/L 21  21  20     A/P: pod3 s/p rltcs/btl Doing well, contin care Severe pre-e - labs normalizing, creatinine had increased  yesterday but normal this am, plts also trending back up from 103; now on labetalol 200mg  tid to better control mild range bps; contin to follow and plan d/c home tomorrow; good diuresis Breastfeeding; one baby in NICU working on feeding, other in room Rh pos Hypothyroid - synthroid Obesity Back pain - contin heating pad, anesthesia aware RI

## 2022-11-02 NOTE — Lactation Note (Signed)
This note was copied from a baby's chart. Lactation Consultation Note  Patient Name: Christina Costa QMVHQ'[id number] Date: 11/02/2022 Age:31 hours Reason for consult: Follow-up assessment;Late-preterm 34-36.6wks;Infant < 6lbs;Infant weight loss (3 % weight loss) Baby A - with mom , Baby B in NICU and per mom may be transferred to her room later today.  Baby latched on the left breast with depth, multiple swallows and per mom comfortable. After Breast feeding mom feeding baby donor milk from the Dr. Manson Passey pace feeding and baby tolerating well.   Maternal Data Has patient been taught Hand Expression?: Yes  Feeding Mother's Current Feeding Choice: Breast Milk and Donor Milk  LATCH Score - baby already latched when LC entered  Latch:  (latched in the cross cradle position with depth)  Audible Swallowing:  (increased swallows with breast compressions)  Type of Nipple:  (nipple well rounded when the baby released)  Comfort (Breast/Nipple):  (per mom comfortable)  Hold (Positioning):  (baby was already latched)      Lactation Tools Discussed/Used Tools: Pump;Flanges Flange Size: 21 Breast pump type: Double-Electric Breast Pump Pumped volume: 10 mL  Interventions Interventions: Breast feeding basics reviewed;Breast compression;Support pillows;DEBP;Education;Pace feeding;LPT handout/interventions  Discharge Pump: Personal;Hands Free  Consult Status Consult Status: Follow-up Date: 11/03/22 Follow-up type: In-patient    Christina Costa 11/02/2022, 1:08 PM

## 2022-11-02 NOTE — Progress Notes (Signed)
Called by RN and notified of patient complaining of tenderness in the area of neuraxial anesthesia placement. Patient examined with the nurse in room 1S09. Motor and sensation intact to bilateral lower extremities. No evidence of allergic reaction, infection, or bruising to the lumbar area. No intervention needed at this time.

## 2022-11-03 ENCOUNTER — Inpatient Hospital Stay (HOSPITAL_COMMUNITY): Payer: BC Managed Care – PPO

## 2022-11-03 LAB — TYPE AND SCREEN
ABO/RH(D): B POS
Antibody Screen: NEGATIVE
Unit division: 0
Unit division: 0

## 2022-11-03 LAB — BPAM RBC
Blood Product Expiration Date: 202410222359
Blood Product Expiration Date: 202410222359
Unit Type and Rh: 7300
Unit Type and Rh: 7300

## 2022-11-03 MED ORDER — HYDROCHLOROTHIAZIDE 12.5 MG PO TABS
12.5000 mg | ORAL_TABLET | Freq: Every day | ORAL | Status: DC
  Filled 2022-11-03: qty 1

## 2022-11-03 MED ORDER — AMLODIPINE BESYLATE 5 MG PO TABS
5.0000 mg | ORAL_TABLET | Freq: Every day | ORAL | Status: DC
  Administered 2022-11-03: 5 mg via ORAL
  Filled 2022-11-03: qty 1

## 2022-11-03 MED ORDER — LABETALOL HCL 200 MG PO TABS
300.0000 mg | ORAL_TABLET | Freq: Two times a day (BID) | ORAL | Status: DC
  Administered 2022-11-03 – 2022-11-04 (×3): 300 mg via ORAL
  Filled 2022-11-03 (×3): qty 1

## 2022-11-03 MED ORDER — LABETALOL HCL 200 MG PO TABS: 300.0000 mg | ORAL_TABLET | Freq: Three times a day (TID) | ORAL | Status: DC

## 2022-11-03 MED ORDER — METOCLOPRAMIDE HCL 10 MG PO TABS
10.0000 mg | ORAL_TABLET | Freq: Four times a day (QID) | ORAL | Status: DC | PRN
  Administered 2022-11-03: 10 mg via ORAL
  Filled 2022-11-03: qty 1

## 2022-11-03 MED ORDER — MAGNESIUM OXIDE -MG SUPPLEMENT 400 (240 MG) MG PO TABS
400.0000 mg | ORAL_TABLET | Freq: Two times a day (BID) | ORAL | Status: DC
  Administered 2022-11-03 – 2022-11-04 (×3): 400 mg via ORAL
  Filled 2022-11-03 (×3): qty 1

## 2022-11-03 MED ORDER — AMLODIPINE BESYLATE 5 MG PO TABS
5.0000 mg | ORAL_TABLET | Freq: Every day | ORAL | Status: DC
  Administered 2022-11-04: 5 mg via ORAL
  Filled 2022-11-03: qty 1

## 2022-11-03 MED ORDER — LABETALOL HCL 200 MG PO TABS
200.0000 mg | ORAL_TABLET | Freq: Every day | ORAL | Status: DC
  Administered 2022-11-03: 200 mg via ORAL
  Filled 2022-11-03: qty 1

## 2022-11-03 NOTE — Progress Notes (Signed)
Postpartum Progress Note   S: Patient seen and examined at bedside. Reports return of headache this morning, now 6 out of 10 in severity after Advil and Tylenol. Feels frontal pressure, especially behind her left eye.  Pain well controlled. Ambulating, voiding, tolerating po without issue, having regular bowel movement. Denies visual changes, chest pain, SOB, RUQ pain, new/worsening LEE.  O:  Vitals:   11/02/22 2342 11/03/22 0245 11/03/22 0657 11/03/22 0902  BP: 136/68 (!) 151/85 (!) 155/81 (!) 140/78  Pulse: 70 84 81 70  Resp: 18 20 20 19   Temp: 98.5 F (36.9 C) 98.5 F (36.9 C) 98.3 F (36.8 C) 98.1 F (36.7 C)  TempSrc: Oral Oral Oral Axillary  SpO2:   98% 99%  Weight:      Height:         GA: well appearing, NAD Chest: normal work of breathing on room air Abd: soft, non-tender, fundus firm below umbilicus Ext: no TTP   A/P: 82NF [id number] POD#4 s/p 3' RCS for DCDA twins with severe preeclampsia (on labetalol 200mg  TID) with continued elevated BP's and return of headache this AM. Patient with continued high mild range blood pressures. Will increase labetalol to 800mg /day in three doses and add amlodipine 5mg . Will also obtain Brain MRI to rule out PRES given ongoing headache not responsive to medications. Will give reglan and mag ox now on top of standing advil and tylenol. Will consider neuro consult pending MRI results and improvement in symptoms. Possible discharge later today if negative imaging with improvement in headache and plan for continued BP monitoring at home with close outpatient follow-up.   Marlene Bast, MD

## 2022-11-03 NOTE — Lactation Note (Signed)
This note was copied from a baby's chart. Lactation Consultation Note  Patient Name: Girl Brennan Ostrem [id number] Date: 11/03/2022 Age:31 days Reason for consult: Follow-up assessment;Late-preterm 34-36.6wks;Infant < 6lbs;Maternal endocrine disorder  LC in to visit with P3 Mom of LPT twins.  Both babies are rooming-in with parents now.    Baby A is at 2.5% weight loss Baby B is at a 4.9% weight loss, transferred back from NICU yesterday evening.  Mom reports that both babies go to the breast and feed for 5-20 mins.   Both babies are being supplemented with donor milk and last night, 22 cal formula due to low donor milk supply.   Babies are paced bottle feeding with a Dr. Theora Gianotti ultra preemie nipple.  Mom admits to only pumping 4 times yesterday.  She is concerned about her milk supply and avoiding mastitis, which she suffered from with one of her older babies.  Breasts are filling, not engorged.  LC palpated some developing milk ducts, no worries about mastitis presently.    LC assisted with pumping on maintenance setting using 18 mm flanges (down from 21)  Mom states she is going to focus on pumping more that latching babies today, as they are tiring out at the breast.  Both babies bilirubins are trending up, but below light levels  Plan recommended- 1- STS with babies as much as possible 2-Pump consistently every 2-3 hrs to support a full milk supply 3- supplement babies with EBM+/donor breast milk per volume guidelines on CRIB cards. 4- Offer the breast with cues, but limit time to 15 mins maximum.   Lactation Tools Discussed/Used Tools: Pump;Flanges;Hands-free pumping top;Bottle Flange Size: 18 Breast pump type: Double-Electric Breast Pump Pump Education: Setup, frequency, and cleaning;Milk Storage Reason for Pumping: support milk supply Pumping frequency: Mom pumped 4 times yesterday, will increase frequency today Pumped volume: 30 mL  Interventions Interventions: Breast  feeding basics reviewed;Skin to skin;Breast massage;Hand express;DEBP;Pace feeding;Education  Discharge Discharge Education: Engorgement and breast care;Outpatient recommendation (Mom plans to have IBCLC who makes home visits F/U with her Shanda Bumps)) Pump: Personal;DEBP (Spectra)  Consult Status Consult Status: Follow-up Date: 11/04/22 Follow-up type: In-patient    Judee Clara 11/03/2022, 12:32 PM

## 2022-11-04 LAB — SURGICAL PATHOLOGY

## 2022-11-04 MED ORDER — AMLODIPINE BESYLATE 5 MG PO TABS: 5.0000 mg | ORAL_TABLET | Freq: Every day | ORAL | 1 refills | Status: AC

## 2022-11-04 NOTE — Progress Notes (Signed)
Postpartum Progress Note   S: Patient seen and examined at bedside. Reports no headache today.  Pain well controlled. Ambulating, voiding, tolerating po without issue, having regular bowel movement. Denies visual changes, chest pain, SOB, RUQ pain, new/worsening LEE.  O:  Vitals:   11/03/22 2104 11/04/22 0030 11/04/22 0510 11/04/22 0826  BP: (!) 151/78 (!) 145/79 133/67 (!) 141/86  Pulse: 81 84 67 69  Resp: 19 18 20 19   Temp: 97.8 F (36.6 C) 98.2 F (36.8 C) 97.6 F (36.4 C) 98.2 F (36.8 C)  TempSrc: Oral Oral Oral Oral  SpO2: 100% 100% 99% 100%  Weight:      Height:         GA: well appearing, NAD Chest: normal work of breathing on room air Abd: soft, non-tender, fundus firm below umbilicus Ext: no TTP   A/P: 60VP [id number] POD#4 s/p 3' RCS for DCDA twins with severe preeclampsia with improved bps and stable labs on labetalol and amlodipine.  Brain MRI neg and headache resolved.  DC home Short term office fu PEC precautions.

## 2022-11-04 NOTE — Plan of Care (Signed)
Problem: Education: Goal: Knowledge of disease or condition will improve Outcome: Adequate for Discharge Goal: Knowledge of the prescribed therapeutic regimen will improve Outcome: Adequate for Discharge   Problem: Fluid Volume: Goal: Peripheral tissue perfusion will improve Outcome: Adequate for Discharge   Problem: Clinical Measurements: Goal: Complications related to disease process, condition or treatment will be avoided or minimized Outcome: Adequate for Discharge   Problem: Education: Goal: Knowledge of General Education information will improve Description: Including pain rating scale, medication(s)/side effects and non-pharmacologic comfort measures Outcome: Adequate for Discharge   Problem: Health Behavior/Discharge Planning: Goal: Ability to manage health-related needs will improve Outcome: Adequate for Discharge   Problem: Clinical Measurements: Goal: Ability to maintain clinical measurements within normal limits will improve Outcome: Adequate for Discharge Goal: Will remain free from infection Outcome: Adequate for Discharge Goal: Diagnostic test results will improve Outcome: Adequate for Discharge Goal: Respiratory complications will improve Outcome: Adequate for Discharge Goal: Cardiovascular complication will be avoided Outcome: Adequate for Discharge   Problem: Activity: Goal: Risk for activity intolerance will decrease Outcome: Adequate for Discharge   Problem: Nutrition: Goal: Adequate nutrition will be maintained Outcome: Adequate for Discharge   Problem: Coping: Goal: Level of anxiety will decrease Outcome: Adequate for Discharge   Problem: Elimination: Goal: Will not experience complications related to bowel motility Outcome: Adequate for Discharge Goal: Will not experience complications related to urinary retention Outcome: Adequate for Discharge   Problem: Pain Managment: Goal: General experience of comfort will improve Outcome: Adequate  for Discharge   Problem: Safety: Goal: Ability to remain free from injury will improve Outcome: Adequate for Discharge   Problem: Skin Integrity: Goal: Risk for impaired skin integrity will decrease Outcome: Adequate for Discharge   Problem: Education: Goal: Knowledge of the prescribed therapeutic regimen will improve Outcome: Adequate for Discharge Goal: Understanding of sexual limitations or changes related to disease process or condition will improve Outcome: Adequate for Discharge Goal: Individualized Educational Video(s) Outcome: Adequate for Discharge   Problem: Self-Concept: Goal: Communication of feelings regarding changes in body function or appearance will improve Outcome: Adequate for Discharge   Problem: Skin Integrity: Goal: Demonstration of wound healing without infection will improve Outcome: Adequate for Discharge   Problem: Education: Goal: Knowledge of condition will improve Outcome: Adequate for Discharge Goal: Individualized Educational Video(s) Outcome: Adequate for Discharge Goal: Individualized Newborn Educational Video(s) Outcome: Adequate for Discharge   Problem: Activity: Goal: Will verbalize the importance of balancing activity with adequate rest periods Outcome: Adequate for Discharge Goal: Ability to tolerate increased activity will improve Outcome: Adequate for Discharge   Problem: Coping: Goal: Ability to identify and utilize available resources and services will improve Outcome: Adequate for Discharge   Problem: Life Cycle: Goal: Chance of risk for complications during the postpartum period will decrease Outcome: Adequate for Discharge   Problem: Role Relationship: Goal: Ability to demonstrate positive interaction with newborn will improve Outcome: Adequate for Discharge   Problem: Skin Integrity: Goal: Demonstration of wound healing without infection will improve Outcome: Adequate for Discharge

## 2022-11-04 NOTE — Lactation Note (Signed)
This note was copied from a baby's chart. Lactation Consultation Note  Patient Name: Christina Costa [id number] Date: 11/04/2022 Age:31 days Reason for consult: Follow-up assessment;NICU baby;Late-preterm 34-36.6wks;Infant < 6lbs;Maternal endocrine disorder  LC in to visit with P3 Mom of LPT twins.  Both babies weights have stabilized and increased.  Mom feels babies are latching to the breast well, hearing swallows and softening of breasts noted.  Mom is still supplementing babies after breast.    Last pumping, Mom expressed 120 ml.  Engorgement prevention and treatment reviewed. Mom to have home visit from lactation consultant after discharge.  No concerns at present.  Mom to call prn for concerns or need for assistance.  Lactation Tools Discussed/Used Tools: Pump;Flanges Flange Size: 18 Breast pump type: Double-Electric Breast Pump Pump Education: Setup, frequency, and cleaning;Milk Storage Reason for Pumping: Support milk supply Pumping frequency: 4 times yesterday, encouraged pumping when babies are supplemented Pumped volume: 120 mL  Interventions Interventions: Breast feeding basics reviewed;Skin to skin;Breast massage;Hand express;DEBP;Education  Discharge Discharge Education: Engorgement and breast care;Outpatient recommendation  Consult Status Consult Status: Follow-up Date: 11/05/22 Follow-up type: In-patient    Christina Costa 11/04/2022, 10:49 AM

## 2022-11-04 NOTE — Discharge Summary (Signed)
OB Discharge Summary  Patient Name: Christina Costa DOB: 08/15/1990 MRN: [id number]  Date of admission: 10/30/2022 Delivering provider:    Chevy, Zegarra [[id number]]  D'IORIO, MARIA Kayliee, Skurka [[id number]]  D'IORIO, MARIA A  Admitting diagnosis: Severe preeclampsia, third trimester [O14.13] Gestational hypertension [O13.9] Intrauterine pregnancy: [redacted]w[redacted]d     Secondary diagnosis: Patient Active Problem List   Diagnosis Date Noted   Gestational hypertension 10/31/2022   Severe preeclampsia, third trimester 10/30/2022   Repeat low transverse cesarean section 9/19 05/29/2021   Postpartum care following cesarean delivery 9/19 05/29/2021   Gestational thrombocytopenia 05/28/2021   Hypothyroidism 09/19/2020   Additional problems:na   Date of discharge: 11/04/2022   Discharge diagnosis: Principal Problem:   Postpartum care following cesarean delivery 9/19 Active Problems:   Hypothyroidism   Gestational thrombocytopenia   Repeat low transverse cesarean section 9/19   Severe preeclampsia, third trimester   Gestational hypertension                                                              Post partum procedures: na  Augmentation: N/A Pain control:    Setareh, Loiacono [[id number]]  Spinal    Jelisha, Retallick [[id number]]  Spinal Laceration:   Kazmira, Harmening Girl Christyanna [[id number]]  None    Abeeha, Bollin [[id number]]  None Episiotomy:   Aashka, Gaschler [[id number]]  None    Shaaron, Bacani [[id number]]  None Complications: None  Hospital course:  Sceduled C/S   32 y.o. yo [id number] at [redacted]w[redacted]d was admitted to the hospital 10/30/2022 for scheduled cesarean section with the following indication:Elective Repeat.Delivery details are as follows:  Membrane Rupture Time/Date:    Jennay, Pivirotto [[id number]]  10:39 PM    Shakeira, Pasinski [[id number]]  10:39 PM,   Ashi, Kritz Girl Drystal [[id number]]  10/30/2022     Amyiah, Paulos [[id number]]  10/30/2022  Delivery Method:   Icy, Champigny [[id number]]  C-Section, Low Transverse    Zeruiah, Hamidi [[id number]]  C-Section, Low Transverse Operative Delivery:N/A Details of operation can be found in separate operative note.  Patient had a postpartum course complicated byna.  She is ambulating, tolerating a regular diet, passing flatus, and urinating well. Patient is discharged home in stable condition on  11/04/22        Newborn Data: Birth date:   Nykisha, Jay [[id number]]  10/30/2022    Lovelynn, Riley [[id number]]  10/30/2022 Birth time:   Leticha, Leinbach [[id number]]  10:40 PM    Loeta, Eichel [[id number]]  10:42 PM Gender:   Sullivan, Backhus [[id number]]  Female    Thao, Mansell [[id number]]  Female Living status:   Suleika, Quero [[id number]]  Living    Floria, Rowlee [[id number]]  Living Apgars:   Palin, Klamm [[id number]]  [address] [[id number]]  7 ,   QBHALPFX, TKWI OXBDZ [[id number]]  8    Zyair, Perelman [[id number]]  8  Weight:   Betzabeth, Cavaness [[id number]]  2570 g    Claria, Giarrusso [[id number]]  2670 g    Subjective: alert Physical exam  Vitals:   11/03/22 2104 11/04/22 0030 11/04/22 0510 11/04/22 0826  BP: (!) 151/78 (!) 145/79 133/67 Marland Kitchen)  141/86  Pulse: 81 84 67 69  Resp: 19 18 20 19   Temp: 97.8 F (36.6 C) 98.2 F (36.8 C) 97.6 F (36.4 C) 98.2 F (36.8 C)  TempSrc: Oral Oral Oral Oral  SpO2: 100% 100% 99% 100%  Weight:      Height:       General: alert, cooperative, and no distress Lochia: appropriate Uterine Fundus: firm Incision: Healing well with no significant drainage Perineum: repair na, tr edema DVT Evaluation: No cords or calf tenderness. Labs: Lab Results  Component Value Date   WBC 5.8 11/02/2022   HGB 10.3 (L) 11/02/2022   HCT 31.7 (L) 11/02/2022   MCV 85.4 11/02/2022    PLT 122 (L) 11/02/2022      Latest Ref Rng & Units 11/02/2022    8:18 AM  CMP  Glucose 70 - 99 mg/dL 90   BUN 6 - 20 mg/dL 18   Creatinine 8.65 - 1.00 mg/dL 7.84   Sodium 696 - 295 mmol/L 135   Potassium 3.5 - 5.1 mmol/L 4.0   Chloride 98 - 111 mmol/L 106   CO2 22 - 32 mmol/L 21   Calcium 8.9 - 10.3 mg/dL 8.8   Total Protein 6.5 - 8.1 g/dL 5.6   Total Bilirubin 0.3 - 1.2 mg/dL 0.3   Alkaline Phos 38 - 126 U/L 103   AST 15 - 41 U/L 24   ALT 0 - 44 U/L 21       10/31/2022   11:31 AM 06/10/2021    9:14 AM 05/29/2021    5:40 PM 07/10/2018    5:00 PM  Edinburgh Postnatal Depression Scale Screening Tool  I have been able to laugh and see the funny side of things. 0 0 0 0  I have looked forward with enjoyment to things. 0 0 0 0  I have blamed myself unnecessarily when things went wrong. 0 0 0 0  I have been anxious or worried for no good reason. 0 0 0 0  I have felt scared or panicky for no good reason. 0 0 0 0  Things have been getting on top of me. 0 0 1 0  I have been so unhappy that I have had difficulty sleeping. 0 0 0 0  I have felt sad or miserable. 0 0 0 0  I have been so unhappy that I have been crying. 0 0 0 0  The thought of harming myself has occurred to me. 0 0 0 0  Edinburgh Postnatal Depression Scale Total 0 0 1 0   Vaccines: TDaP         na         Flu na Discharge instruction:  per After Visit Summary,  [facility] booklet and  "[facility]" hospital booklet After Visit Meds:  Allergies as of 11/04/2022       Reactions   Wheat    Gluten Meal Nausea And Vomiting        Medication List     STOP taking these medications    acetaminophen 500 MG tablet Commonly known as: TYLENOL   aspirin EC 81 MG tablet   ondansetron 4 MG tablet Commonly known as: ZOFRAN   potassium iodide 1 GM/ML solution Commonly known as: SSKI   prenatal multivitamin Tabs tablet   promethazine 25 MG tablet Commonly known as: PHENERGAN       TAKE  these medications    amLODipine 5 MG tablet Commonly known as: NORVASC Take  1 tablet (5 mg total) by mouth daily. Start taking on: November 05, 2022   labetalol 100 MG tablet Commonly known as: NORMODYNE Take 200 mg by mouth 2 (two) times daily.   Synthroid 100 MCG tablet Generic drug: levothyroxine Take 100 mcg by mouth daily.       Diet: routine diet Activity: Advance as tolerated. Pelvic rest for 6 weeks.  Postpartum contraception: TBA in office Newborn Data:   Aerionna, Waithe [[id number]]  Live born female  Birth Weight: 5 lb 10.7 oz (2570 g) APGAR: 8, 8  Newborn Delivery   Birth date/time: 10/30/2022 22:40:00 Delivery type: C-Section, Low Transverse Trial of labor: No C-section categorization: Repeat       Ariahna, Hankerson [[id number]]  Live born female  Birth Weight: 5 lb 14.2 oz (2670 g) APGAR: 8, 8  Newborn Delivery   Birth date/time: 10/30/2022 22:42:00 Delivery type: C-Section, Low Transverse Trial of labor: No C-section categorization: Repeat     named na Baby Feeding: Breast Disposition:home with mother Circumcision: na Delivery Report: Review the Delivery Report for details.   Follow up: 1 week  Signed: Milbert Coulter, MSN 11/04/2022, 11:31 AM

## 2022-11-07 ENCOUNTER — Encounter (HOSPITAL_COMMUNITY)
Admission: RE | Admit: 2022-11-07 | Discharge: 2022-11-07 | Disposition: A | Discharge status: Home / Self Care | Payer: BC Managed Care – PPO | Source: Ambulatory Visit

## 2022-11-09 ENCOUNTER — Inpatient Hospital Stay (HOSPITAL_COMMUNITY)
Admission: RE | Admit: 2022-11-09 | Payer: BC Managed Care – PPO | Source: Home / Self Care | Admitting: Obstetrics & Gynecology

## 2022-11-26 ENCOUNTER — Telehealth (HOSPITAL_COMMUNITY): Payer: Self-pay | Admitting: *Deleted

## 2022-11-26 NOTE — Telephone Encounter (Signed)
11/26/2022  Name: Christina Costa MRN: [id number] DOB: Jun 30, 1990  Reason for Call:  Transition of Care Hospital Discharge Call  Contact Status: Patient Contact Status: Complete  Language assistant needed:          Follow-Up Questions: Do You Have Any Concerns About Your Health As You Heal From Delivery?: No Do You Have Any Concerns About Your Infants Health?: No  Edinburgh Postnatal Depression Scale:  In the Past 7 Days:   EPDS not completed at this time. Patient stated, "I completed it either with my pediatrician or with the home visiting nurse. Or maybe with both. I'm fine. I don't need to do it again at this time." PHQ2-9 Depression Scale:     Discharge Follow-up: Edinburgh score requires follow up?: N/A Patient was advised of the following resources:: Breastfeeding Support Group, Support Group  Post-discharge interventions: Reviewed Newborn Safe Sleep Practices  Signature Deforest Hoyles, RN, 11/26/22, [id number]

## 2023-10-02 ENCOUNTER — Other Ambulatory Visit (HOSPITAL_COMMUNITY): Payer: Self-pay
